# Patient Record
Sex: Female | Born: 1940
Health system: Southern US, Community
[De-identification: ages and names within clinical notes are randomized; demographics above are authoritative.]

## PROBLEM LIST (undated history)

## (undated) DIAGNOSIS — F32A Depression, unspecified: Secondary | ICD-10-CM

## (undated) DIAGNOSIS — G301 Alzheimer's disease with late onset: Secondary | ICD-10-CM

## (undated) DIAGNOSIS — F03A0 Unspecified dementia, mild, without behavioral disturbance, psychotic disturbance, mood disturbance, and anxiety: Secondary | ICD-10-CM

## (undated) DIAGNOSIS — D0360 Melanoma in situ of unspecified upper limb, including shoulder: Secondary | ICD-10-CM

## (undated) DIAGNOSIS — F329 Major depressive disorder, single episode, unspecified: Secondary | ICD-10-CM

## (undated) DIAGNOSIS — M8580 Other specified disorders of bone density and structure, unspecified site: Secondary | ICD-10-CM

## (undated) DIAGNOSIS — G4700 Insomnia, unspecified: Secondary | ICD-10-CM

## (undated) DIAGNOSIS — F0390 Unspecified dementia without behavioral disturbance: Secondary | ICD-10-CM

## (undated) DIAGNOSIS — C342 Malignant neoplasm of middle lobe, bronchus or lung: Secondary | ICD-10-CM

## (undated) DIAGNOSIS — J449 Chronic obstructive pulmonary disease, unspecified: Secondary | ICD-10-CM

## (undated) HISTORY — DX: Insomnia, unspecified: G47.00

## (undated) HISTORY — DX: Unspecified dementia without behavioral disturbance: F03.90

## (undated) HISTORY — DX: Chronic obstructive pulmonary disease, unspecified: J44.9

## (undated) HISTORY — PX: TUBAL LIGATION: SHX77

## (undated) HISTORY — DX: Unspecified dementia, mild, without behavioral disturbance, psychotic disturbance, mood disturbance, and anxiety: F03.A0

## (undated) HISTORY — DX: Other specified disorders of bone density and structure, unspecified site: M85.80

## (undated) HISTORY — DX: Melanoma in situ of unspecified upper limb, including shoulder: D03.60

## (undated) HISTORY — DX: Major depressive disorder, single episode, unspecified: F32.9

## (undated) HISTORY — DX: Malignant neoplasm of middle lobe, bronchus or lung: C34.2

## (undated) HISTORY — DX: Alzheimer's disease with late onset: G30.1

## (undated) HISTORY — DX: Depression, unspecified: F32.A

---

## 2005-01-04 ENCOUNTER — Ambulatory Visit: Payer: Self-pay | Admitting: Family Medicine

## 2005-01-21 ENCOUNTER — Ambulatory Visit: Payer: Self-pay | Admitting: Family Medicine

## 2005-01-21 ENCOUNTER — Other Ambulatory Visit: Admission: RE | Admit: 2005-01-21 | Discharge: 2005-01-21 | Payer: Self-pay | Admitting: Family Medicine

## 2005-06-16 ENCOUNTER — Ambulatory Visit: Payer: Self-pay | Admitting: Family Medicine

## 2005-07-25 ENCOUNTER — Ambulatory Visit: Payer: Self-pay | Admitting: Family Medicine

## 2005-12-04 ENCOUNTER — Ambulatory Visit: Payer: Self-pay | Admitting: Family Medicine

## 2010-05-09 ENCOUNTER — Ambulatory Visit: Payer: Self-pay | Admitting: Thoracic Surgery (Cardiothoracic Vascular Surgery)

## 2010-05-13 ENCOUNTER — Ambulatory Visit (HOSPITAL_COMMUNITY)
Admission: RE | Admit: 2010-05-13 | Discharge: 2010-05-13 | Payer: Self-pay | Admitting: Thoracic Surgery (Cardiothoracic Vascular Surgery)

## 2010-05-20 ENCOUNTER — Inpatient Hospital Stay (HOSPITAL_COMMUNITY)
Admission: RE | Admit: 2010-05-20 | Discharge: 2010-05-23 | Payer: Self-pay | Admitting: Thoracic Surgery (Cardiothoracic Vascular Surgery)

## 2010-05-20 ENCOUNTER — Encounter: Payer: Self-pay | Admitting: Thoracic Surgery (Cardiothoracic Vascular Surgery)

## 2010-05-20 ENCOUNTER — Ambulatory Visit: Payer: Self-pay | Admitting: Thoracic Surgery (Cardiothoracic Vascular Surgery)

## 2010-05-21 HISTORY — PX: OTHER SURGICAL HISTORY: SHX169

## 2010-05-30 ENCOUNTER — Ambulatory Visit: Payer: Self-pay | Admitting: Thoracic Surgery (Cardiothoracic Vascular Surgery)

## 2010-06-13 ENCOUNTER — Ambulatory Visit: Payer: Self-pay | Admitting: Thoracic Surgery (Cardiothoracic Vascular Surgery)

## 2010-06-13 ENCOUNTER — Encounter
Admission: RE | Admit: 2010-06-13 | Discharge: 2010-06-13 | Payer: Self-pay | Admitting: Thoracic Surgery (Cardiothoracic Vascular Surgery)

## 2010-08-12 ENCOUNTER — Encounter
Admission: RE | Admit: 2010-08-12 | Discharge: 2010-08-12 | Payer: Self-pay | Admitting: Thoracic Surgery (Cardiothoracic Vascular Surgery)

## 2010-08-12 ENCOUNTER — Ambulatory Visit: Payer: Self-pay | Admitting: Thoracic Surgery (Cardiothoracic Vascular Surgery)

## 2010-10-19 ENCOUNTER — Other Ambulatory Visit: Payer: Self-pay | Admitting: Thoracic Surgery (Cardiothoracic Vascular Surgery)

## 2010-10-19 DIAGNOSIS — Z85118 Personal history of other malignant neoplasm of bronchus and lung: Secondary | ICD-10-CM

## 2010-11-07 ENCOUNTER — Encounter (INDEPENDENT_AMBULATORY_CARE_PROVIDER_SITE_OTHER): Payer: Medicare PPO | Admitting: Thoracic Surgery (Cardiothoracic Vascular Surgery)

## 2010-11-07 ENCOUNTER — Ambulatory Visit
Admission: RE | Admit: 2010-11-07 | Discharge: 2010-11-07 | Disposition: A | Discharge status: Home / Self Care | Payer: Medicare PPO | Source: Ambulatory Visit | Attending: Thoracic Surgery (Cardiothoracic Vascular Surgery) | Admitting: Thoracic Surgery (Cardiothoracic Vascular Surgery)

## 2010-11-07 DIAGNOSIS — Z85118 Personal history of other malignant neoplasm of bronchus and lung: Secondary | ICD-10-CM

## 2010-11-07 DIAGNOSIS — C342 Malignant neoplasm of middle lobe, bronchus or lung: Secondary | ICD-10-CM

## 2010-11-08 NOTE — Assessment & Plan Note (Addendum)
OFFICE VISIT  ASCENCION, STEGNER DOB:  12/23/1940                                        November 07, 2010 CHART #:  [id number]  The patient is a 70 year old woman who had right VATS and middle lobectomy for a stage IA, well-differentiated adenocarcinoma.  This was 1.7 cm in diameter back in August.  I saw her back most recently in November, at which time she was doing well with no evidence of recurrent disease.  She was still taking the occasional Percocet at that time. She now returns because she still has some pain.  She describes the pain and tenderness at the anterior most chest tube site and some pain radiating from that area.  She also has similar pain on the other side. She is taking one Percocet every few days.  Her basic complaint is that she says she has a lack of energy, have a general malaise, and she does not feel very energetic or like herself and that dates back to around the time of her surgery.  Her current medications are Percocet p.r.n.  She has no known drug allergies.  PHYSICAL EXAMINATION:  On physical examination, the patient is a well- appearing 70 year old woman in no acute distress.  Her blood pressure is 137/89, pulse 88, respirations 14, oxygen saturation is 98% on room air. Lungs have equal breath sounds bilaterally.  They are clear.  There is no rales or wheezing.  There is no cervical or supraclavicular adenopathy.  Her incisions are well-healed.  No signs of infection or recurrence.  Chest CT is done, has not been officially read as of yet there was some postsurgical changes on the right side with some scarring.  There is no significant effusions or adenopathy.  There is no sign of recurrent disease to my exam or with the official report which hopefully will have later today.  IMPRESSION:  The patient is a 70 year old woman, she had stage IA non- small cell lung cancer.  She is now 6 months out from resection with  no evidence of recurrent disease.  I will plan to see her back in 3 months, checked a plain chest x-ray at that time, unless there is something on the chest x-ray today that the radiologist was worried about, in this case, we could follow up with a CT.  She is still having some pain.  She is taking a Percocet every few days. I gave her prescription for another 30 of those one or up to two daily as needed.  In terms of her general malaise and lack of energy, it sounds like she may be slightly depressed, certainly then having severe clinical depression, but she does have some signs of maybe a mild depression.  I recommend she talk to Dr. Sudie Bailey, have the medical workup, thyroid testing or whether it needs to be done to evaluate that and see if she needs antidepressants.  Again, I will plan to see her back in 3 months.  We will do a plain chest x-ray at that time.  Salvatore Decent Dorris Fetch, M.D. Electronically Signed  SCH/MEDQ  D:  11/07/2010  T:  11/08/2010  Job:  [id number]  cc:   Philemon Kingdom

## 2010-12-13 LAB — BLOOD GAS, ARTERIAL
Acid-Base Excess: 0.4 mmol/L (ref 0.0–2.0)
Acid-Base Excess: 0.5 mmol/L (ref 0.0–2.0)
Bicarbonate: 24.2 mEq/L — ABNORMAL HIGH (ref 20.0–24.0)
Drawn by: 242311
Drawn by: 31276
O2 Saturation: 97.4 %
O2 Saturation: 99 %
Patient temperature: 98.6
Patient temperature: 98.6
Patient temperature: 98.6
TCO2: 25.4 mmol/L (ref 0–100)
TCO2: 26 mmol/L (ref 0–100)
pCO2 arterial: 36.9 mmHg (ref 35.0–45.0)
pCO2 arterial: 41.3 mmHg (ref 35.0–45.0)
pH, Arterial: 7.395 (ref 7.350–7.400)
pH, Arterial: 7.448 — ABNORMAL HIGH (ref 7.350–7.400)
pO2, Arterial: 122 mmHg — ABNORMAL HIGH (ref 80.0–100.0)

## 2010-12-13 LAB — TYPE AND SCREEN
ABO/RH(D): A POS
Antibody Screen: NEGATIVE

## 2010-12-13 LAB — CBC
HCT: 38.8 % (ref 36.0–46.0)
Hemoglobin: 10.5 g/dL — ABNORMAL LOW (ref 12.0–15.0)
Hemoglobin: 13.6 g/dL (ref 12.0–15.0)
MCH: 31.6 pg (ref 26.0–34.0)
MCH: 32.7 pg (ref 26.0–34.0)
MCH: 32.9 pg (ref 26.0–34.0)
MCHC: 32.8 g/dL (ref 30.0–36.0)
MCV: 96.4 fL (ref 78.0–100.0)
Platelets: 131 10*3/uL — ABNORMAL LOW (ref 150–400)
Platelets: 133 10*3/uL — ABNORMAL LOW (ref 150–400)
RBC: 3.32 MIL/uL — ABNORMAL LOW (ref 3.87–5.11)
RBC: 4.13 MIL/uL (ref 3.87–5.11)
WBC: 11.8 10*3/uL — ABNORMAL HIGH (ref 4.0–10.5)
WBC: 7.2 10*3/uL (ref 4.0–10.5)

## 2010-12-13 LAB — SURGICAL PCR SCREEN
MRSA, PCR: NEGATIVE
Staphylococcus aureus: NEGATIVE

## 2010-12-13 LAB — BASIC METABOLIC PANEL
Creatinine, Ser: 0.61 mg/dL (ref 0.4–1.2)
Glucose, Bld: 160 mg/dL — ABNORMAL HIGH (ref 70–99)
Sodium: 133 mEq/L — ABNORMAL LOW (ref 135–145)

## 2010-12-13 LAB — URINALYSIS, ROUTINE W REFLEX MICROSCOPIC
Glucose, UA: NEGATIVE mg/dL
Ketones, ur: NEGATIVE mg/dL
Nitrite: NEGATIVE
Urobilinogen, UA: 1 mg/dL (ref 0.0–1.0)
pH: 6.5 (ref 5.0–8.0)

## 2010-12-13 LAB — COMPREHENSIVE METABOLIC PANEL
AST: 16 U/L (ref 0–37)
Albumin: 2.6 g/dL — ABNORMAL LOW (ref 3.5–5.2)
Albumin: 4.3 g/dL (ref 3.5–5.2)
BUN: 11 mg/dL (ref 6–23)
BUN: 3 mg/dL — ABNORMAL LOW (ref 6–23)
CO2: 22 mEq/L (ref 19–32)
Calcium: 8.4 mg/dL (ref 8.4–10.5)
Chloride: 108 mEq/L (ref 96–112)
GFR calc Af Amer: >60 mL/min (ref >60)
GFR calc non Af Amer: >60 mL/min (ref >60)
Sodium: 138 mEq/L (ref 135–145)
Total Bilirubin: 0.7 mg/dL (ref 0.3–1.2)
Total Protein: 5 g/dL — ABNORMAL LOW (ref 6.0–8.3)
Total Protein: 6.9 g/dL (ref 6.0–8.3)

## 2010-12-13 LAB — URINE MICROSCOPIC-ADD ON

## 2010-12-13 LAB — ABO/RH: ABO/RH(D): A POS

## 2010-12-13 LAB — PROTIME-INR: INR: 0.91 (ref 0.00–1.49)

## 2011-01-31 IMAGING — CR DG CHEST 2V
2 series · 2 of 2 positions shown · non-contrast
Comparison: 05/23/2010

CLINICAL DATA: Right VATS.

CHEST - 2 VIEW

[w chest pa]
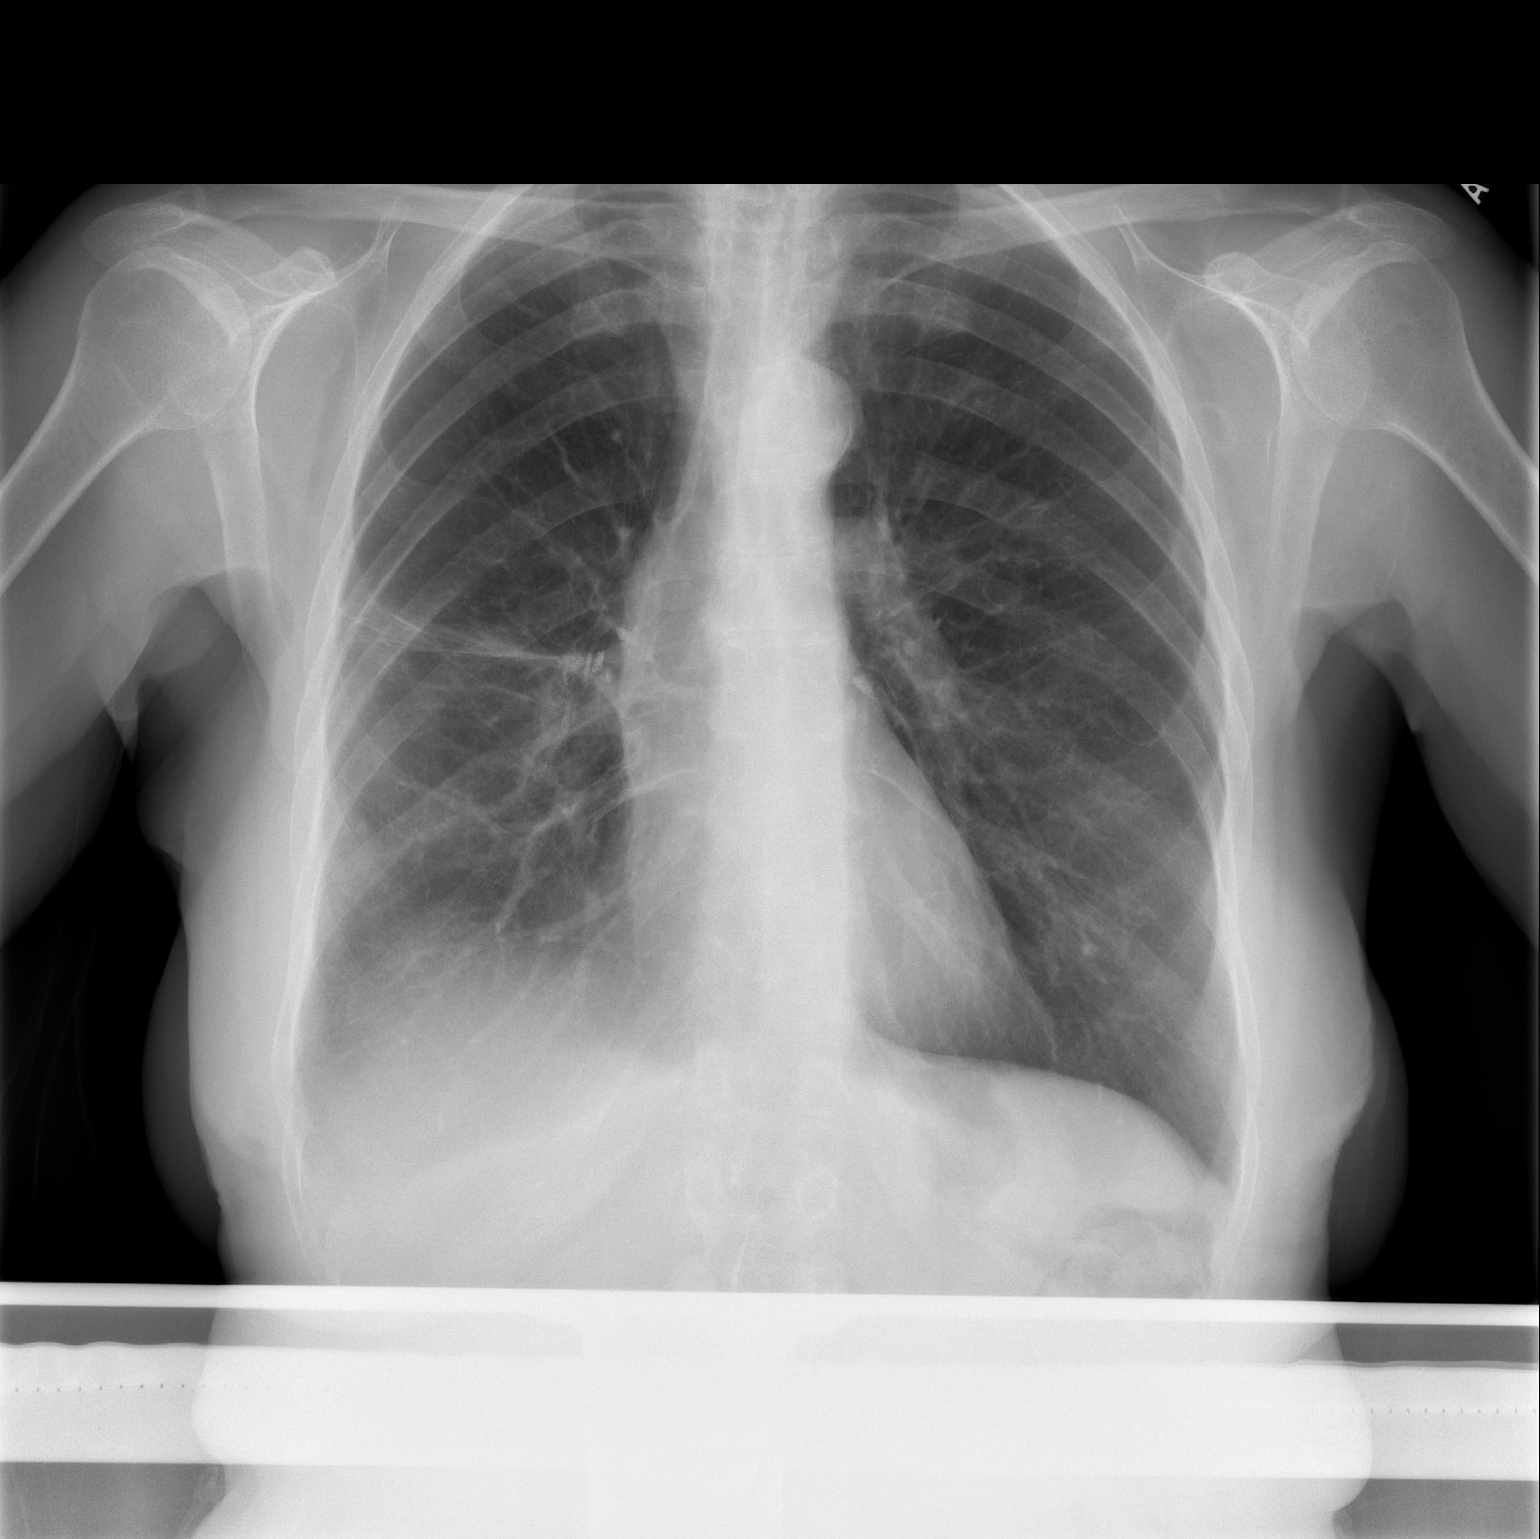

[w chest lat]
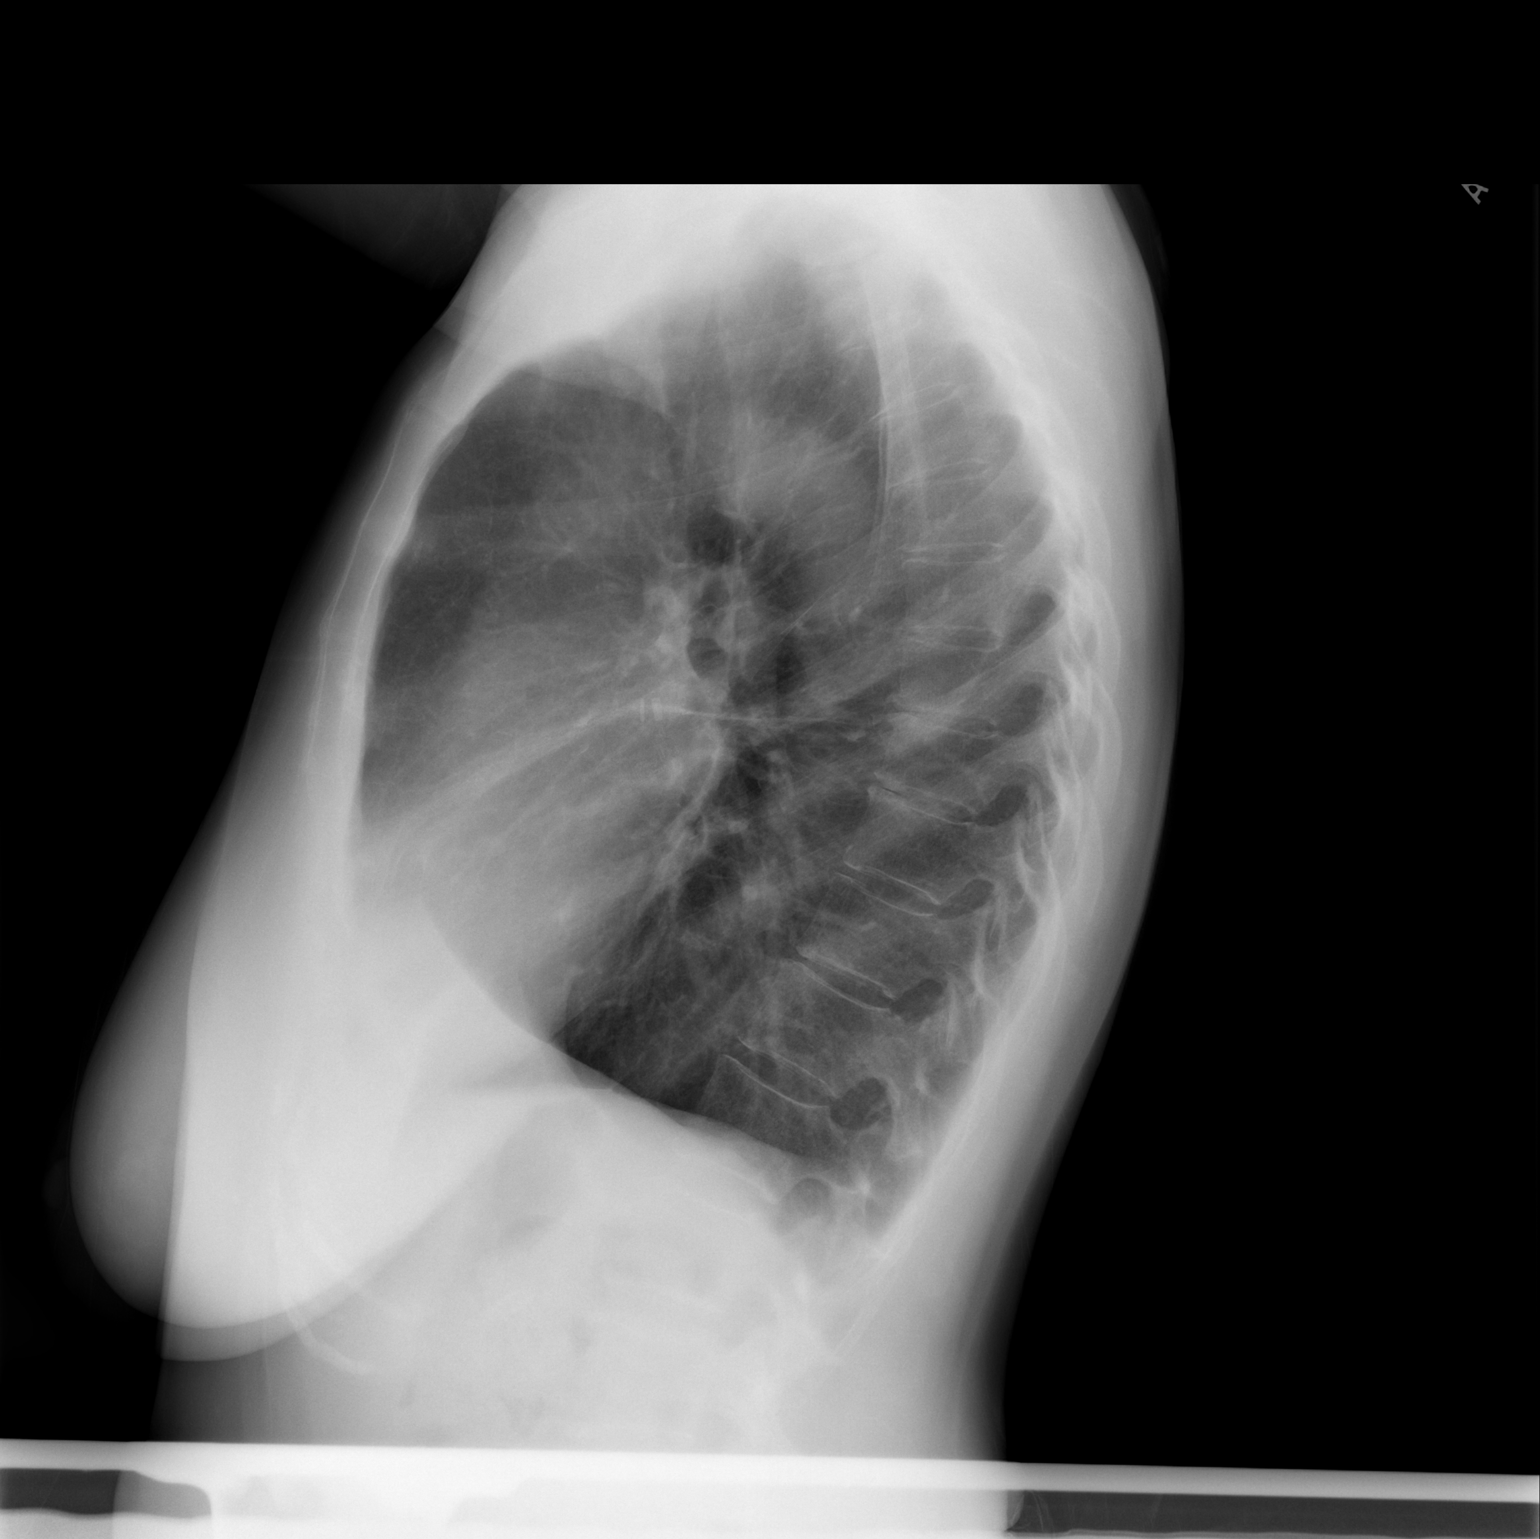

[2 of 2 positions shown; findings below may reference images not displayed]

FINDINGS: Postoperative changes on the right.  Small  right pleural
effusion noted with right lower lobe atelectasis.  Left lung is
clear.  Heart is normal size.  No acute bony abnormality.
IMPRESSION: Postoperative changes on the right with small right effusion and
right lower lobe atelectasis.

## 2011-02-10 ENCOUNTER — Other Ambulatory Visit: Payer: Self-pay | Admitting: Thoracic Surgery (Cardiothoracic Vascular Surgery)

## 2011-02-10 DIAGNOSIS — D381 Neoplasm of uncertain behavior of trachea, bronchus and lung: Secondary | ICD-10-CM

## 2011-02-11 ENCOUNTER — Ambulatory Visit
Admission: RE | Admit: 2011-02-11 | Discharge: 2011-02-11 | Disposition: A | Discharge status: Home / Self Care | Payer: Medicare PPO | Source: Ambulatory Visit | Attending: Thoracic Surgery (Cardiothoracic Vascular Surgery) | Admitting: Thoracic Surgery (Cardiothoracic Vascular Surgery)

## 2011-02-11 ENCOUNTER — Encounter (INDEPENDENT_AMBULATORY_CARE_PROVIDER_SITE_OTHER): Payer: Medicare PPO | Admitting: Thoracic Surgery (Cardiothoracic Vascular Surgery)

## 2011-02-11 DIAGNOSIS — D381 Neoplasm of uncertain behavior of trachea, bronchus and lung: Secondary | ICD-10-CM

## 2011-02-11 NOTE — Assessment & Plan Note (Signed)
OFFICE VISIT   Jessica [name], Jessica [name]  DOB:  Nov 15, 1940                                        June 13, 2010  CHART #:  [id number]   REASON FOR VISIT:  Follow up after right middle lobectomy.   HISTORY OF PRESENT ILLNESS:  The patient is a 70 year old woman with a  history of tobacco abuse who underwent a right video-assisted middle  lobectomy and node dissection in August 2011.  Postoperatively, she did  well.  She now returns a for follow up visit.  She says she is still  having some discomfort, occasionally sharp pains along the costal  margin, morbid, dull aching pain at the incision and chest tube sites.  She takes half an oxycodone tablet at nighttime before she goes to bed  and then primarily uses aspirin during the day.  She does say that about  once a week she will use half a tablet 3 or 4 times in a day.   PHYSICAL EXAMINATION:  The patient is a 70 year old woman in no acute  distress.  Her blood pressure is 112/75, pulse 66, respirations 16, her  oxygen saturation is 98% on room air.  Lungs are clear with equal breath  sounds bilaterally.  Her incisions are clean, dry, intact, and healing  well with no signs of infection.   LABORATORY DATA:  Chest x-ray shows postoperative changes on the right,  otherwise unremarkable.   IMPRESSION:  The patient is doing well.  Now, she is about a month out  from right middle lobectomy.  This was done minimally invasively.  She  still has some discomfort which is to be expected at this point in time  and actually may last for another month or two or in rare cases longer  than that; however, we were able to do this without any rib spreading,  so I think that the pain should resolve in a relatively short period of  time.  She is healing well.  She is not having any respiratory problems.  She is really taking very minimal amount of pain medication.  I did give  her a prescription for an additional 20 oxycodone  tablets up to 2  tablets three 3 times a day as needed for pain and she knows to call  back if she needs any additional pain medication.  I will plan to see  her back in 2 months.  We will do a plain chest x-ray at that time, then  we will plan to do a CT of the chest in 93-month and 1-year followup  visits.  She has no restrictions on her activities.  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   Salvatore Decent Dorris Fetch, M.D.  Electronically Signed   SCH/MEDQ  D:  06/13/2010  T:  06/14/2010  Job:  [id number]

## 2011-02-11 NOTE — H&P (Signed)
HISTORY AND PHYSICAL EXAMINATION   May 09, 2010   Re:  DE[place], [place]            DOB:  1940-12-08   CHIEF COMPLAINT:  Right lung mass.   HISTORY OF PRESENT ILLNESS:  The patient is a 70 year old woman with a  history of tobacco abuse who was found approximately 5 years ago to have  a small right middle lobe nodule.  She was lost to follow up and she  recently went to a new physician, Dr. Sudie Bailey.  Given her age and  smoking history, it was recommended that she have a chest x-ray done.  This showed an enlarged right middle lobe mass.  CT of the chest then  was done which again showed interval enlargement of a solitary  spiculated lung nodule on the lateral aspect of the right middle lobe,  which measured 1.6 x 2.0 cm.  There was a benign calcified granuloma in  the left lower lobe and some benign calcified left hilar nodes.  There  was no hilar or mediastinal adenopathy.  She then had a PET CT done,  which showed increased uptake of the right middle lobe nodule which had  an SUV max of 4.7.  There also was some uptake in the right hilum which  was not associated with any definite adenopathy and this represents  physiologic activity.  The patient now sent for consideration of  surgical treatment.   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   Her current medications are Klonopin 0.5 mg p.o. q.h.s. p.r.n.Marland Kitchen   She has no known drug allergies.   FAMILY HISTORY:  Negative for lung cancer.   SOCIAL HISTORY:  She lives in her own house.  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  Her grandson who is 6 years old currently  lives with her.  She is active and maintains her home.  She has a  history of tobacco abuse which was two packs a day until 1997.  Since  then she has been intermittently stopping and starting smoking for  several months up to a year at that time.  She  is not currently smoking.   REVIEW OF SYSTEMS:  She has lost a few pounds, less than 10 pounds over  the last 6 months.  She does have arthritis, occasional joint pain.  She  does have some depression.  All other systems are negative.   PHYSICAL EXAMINATION:  The patient is a well-appearing 70 year old woman  in no acute distress.  Blood pressure is 119/78, pulse 76, respirations  16.  Her oxygen saturation is 97% on room air.  General:  She is well  developed and well nourished.  Neurologically, she is alert and oriented  x3 with no focal deficits.  Her HEENT exam is unremarkable.  Her neck is  supple without thyromegaly, adenopathy, or bruits.  Her cardiac exam has  regular rate and rhythm.  Normal S1, S2.  No rubs, murmurs, or gallops.  There is no cervical or supraclavicular adenopathy.  Lungs are clear  with equal breath sounds bilaterally.  Abdomen soft, nontender.  Extremities without clubbing, cyanosis, or edema.  She has 2+ pulses  throughout.   LABORATORY DATA:  Radiologic studies were reviewed.  There is no other  laboratory data available at the present time.   IMPRESSION:  The patient is a 70 year old woman with a history  of  tobacco abuse who has a spiculated right middle lobe lesion that has  grown slowly after first being discovered 5 years ago and then  subsequently being lost to follow up.  This is highly suspicious for  primary bronchogenic cancer.  I discussed with the patient the approach  to workup.  This could be needled or bronchoscopically biopsied.  However, that would not change management as this would have  definitively be ruled out as a cancer and I would not be comfortable  given the clinical scenario of an enlarging hypermetabolic mass in a  smoker with anything other than a complete excisional biopsy to rule out  this is a carcinoma.  I recommended that we proceed with VATS and right  middle lobectomy.  I think even trying to wedge this lesion out is   probably going to leave very little usable right middle lobe tissue or  functional tissue.  Therefore, we will just proceed with lobectomy  assuming that a pulmonary function testing is adequate to tolerate that  which should be the case.   We do need to schedule her for pulmonary function testing with a room  air blood gas prior to surgery.   I discussed in detail with the patient and her daughter the general  nature of the operation.  We discussed indications, risks, benefits, and  alternatives including radiation or radiofrequency ablation.  They  understand the need for general anesthesia and the intention to treat  with minimally invasive technique, but they do understand that there is  always a possibility to need to extend the incision or possibly spread  the ribs that could arise during the operation even if the initial  intention is minimally invasive non-rib spreading approach.   They understand the risks include but not limited to death, MI, DVT, PE,  bleeding, possible need for transfusions, infections including wound  infections, IV catheters, pneumonia, bronchitis, or other unforeseeable  and unpredictable infections, a great deal of concern regarding  potential for infection and we discussed that at great length.   The patient understands these issues and wishes to proceed with right  VATS and middle lobectomy on Thursday, August 25.  I will be happy to do  this sooner but she has some family issues that need to be resolved  prior to having her surgery.   Salvatore Decent Dorris Fetch, M.D.  Electronically Signed   SCH/MEDQ  D:  05/09/2010  T:  05/10/2010  Job:  [id number]

## 2011-02-11 NOTE — Assessment & Plan Note (Signed)
OFFICE VISIT   Jessica, [name]  DOB:  1941/05/09                                        August 12, 2010  CHART #:  [id number]   HISTORY:  The patient returns for followup.  She had a right middle  lobectomy and mediastinal node dissection done in August of this year  for a non-small cell carcinoma.  This was a 1.7-cm T1 lesion with  negative nodes, and stage IA.  She was last seen in the office in  September, at which time she was doing well.  She was still having some  pain.  She was taking 2-3 Percocets a day at that time.  She says she is  continuing to have pain mostly referred pain to the right subcostal  region.  She says that she had gone over this most of the time, but  maybe every 5-7 days.  She will take 1-2 Percocets at night and in the  morning.  She tried Advil and Aleve and really did not get much relief.  She was taking aspirin but had to stop that because of a nosebleed.   CURRENT MEDICATIONS:  Percocet 5/325 one or two daily p.r.n.   REVIEW OF SYSTEMS:  Pain as noted.  She also had a recent colonoscopy  and also had a recent nose bleed.  No problems with her breathing  shortness of breath or wheezing.  All other systems are negative.   PHYSICAL EXAMINATION:  General:  The patient is a 70 year old woman in  no acute distress.  Vital Signs:  Her blood pressure is 104/72, pulse  84, respirations were 18, oxygen saturation is 97% on room air.  Lungs:  Clear with equal breath sounds bilaterally.  Cardiac:  Regular rate and  rhythm.  Normal S1 and S2.  No rubs or murmurs.  Her incisions are well  healed.   Chest x-ray shows good aeration.  There are some postsurgical changes on  the right.  No evidence of recurrent disease.   IMPRESSION:  The patient is a 70 year old woman status post a right  middle lobectomy via VATS approach.  She is still having some incisional  discomfort.  I gave her prescription for an additional 30 Percocets 1-2  twice daily as needed for pain, but I did encourage her to try and stop  using those if she can given they are narcotics.  She has tried Aleve  and Advil without any relief, but she may want to try those again and  she is a little further out from the surgery at 3 months now.  It is not  uncommon, she still has some discomfort but really should not be  requiring narcotics on any kind of regular basis.  I will plan to see  her back in 3 months.  We will do a CT of the chest at that time for  _________ 48-month followup postop.   Salvatore Decent Dorris Fetch, M.D.  Electronically Signed   SCH/MEDQ  D:  08/12/2010  T:  08/13/2010  Job:  [id number]   cc:   Philemon Kingdom

## 2011-02-13 ENCOUNTER — Other Ambulatory Visit: Payer: Self-pay | Admitting: Thoracic Surgery (Cardiothoracic Vascular Surgery)

## 2011-02-13 DIAGNOSIS — D381 Neoplasm of uncertain behavior of trachea, bronchus and lung: Secondary | ICD-10-CM

## 2011-02-14 ENCOUNTER — Encounter: Payer: Medicare PPO | Admitting: Thoracic Surgery (Cardiothoracic Vascular Surgery)

## 2011-02-17 ENCOUNTER — Other Ambulatory Visit: Payer: Self-pay | Admitting: Thoracic Surgery (Cardiothoracic Vascular Surgery)

## 2011-02-18 ENCOUNTER — Encounter: Payer: Medicare PPO | Admitting: Thoracic Surgery (Cardiothoracic Vascular Surgery)

## 2011-02-19 ENCOUNTER — Encounter (INDEPENDENT_AMBULATORY_CARE_PROVIDER_SITE_OTHER): Payer: Medicare PPO | Admitting: Thoracic Surgery (Cardiothoracic Vascular Surgery)

## 2011-02-19 DIAGNOSIS — C3490 Malignant neoplasm of unspecified part of unspecified bronchus or lung: Secondary | ICD-10-CM

## 2011-02-20 NOTE — Assessment & Plan Note (Signed)
OFFICE VISIT  Jessica [name], Jessica [name] DOB:  [birth year]                                        Feb 19, 2011 CHART #:  [id number]  REASON FOR VISIT:  Nine-month followup after lung cancer.  HISTORY OF PRESENT ILLNESS:  The patient is a 70 year old woman who had a right middle lobectomy for stage IA non-small cell carcinoma back in August 2011.  She was last seen in the office in February at which time she was doing well with no evidence of recurrent disease.  She was still having pain from her incisions for which she was taking Percocet.  She states that she has continued to have the pain.  She is concerned that there was some mistake made with chest tube removal and the way the suture was tied which left a scar with a different appearance in the other chest tube site.  She says that the pain is more constant now.  It used to be very severe and then let out.  Now it is more of a moderate pain more consistently.  She states that she has not had any difficulty with her breathing.  Her weight has been stable.  She has not had any unusual cough or hemoptysis.  No headaches or visual changes.  No new bone or joint pain.  She says she is currently taking one Percocet tablet about every fifth day for her pain.  CURRENT MEDICATIONS: 1. Klonopin 0.5 mg p.o. at bedtime. 2. Percocet 5/325 one tablet p.r.n. every few days for pain.  ALLERGIES:  She has no known drug allergies.  PAST MEDICAL HISTORY:  Significant for stage IA non-small cell lung cancer resected in August 2011, history of in situ melanoma on her arm, osteopenia, and osteoporosis.  SOCIAL HISTORY:  She is not smoking.  PHYSICAL EXAMINATION:  The patient is a well-appearing 70 year old woman in no acute distress.  Blood pressure is 121/83, pulse 68, respirations are 16, oxygen saturation is 99% on room air.  Lungs clear with equal breath sounds bilaterally.  Her incisions are all well healed.  There is no  cervical, supraclavicular, axillary, epitrochlear adenopathy.  IMAGING:  Chest x-ray is reviewed, shows surgical scarring in the right base, otherwise no evidence of recurrent disease.  IMPRESSION:  The patient is a 70 year old woman status post right middle lobectomy via video-assisted thoracoscopic surgery approach for stage IA lung cancer.  She is now 9 months out from surgery with no evidence of recurrent disease.  She does have continued pain from the surgery.  I reassured her that it was not due to the way the chest tube suture was tied as her pain was in the dermatomal distribution consistent with irritation of the nerve during surgery or from the chest tubes but not the way of the suture in the skin was tied.  She continues to take a Percocet about every fifth day for that.  I gave her a prescription for oxycodone 5 mg tablets, she can supplement that with Tylenol if she wishes.  I gave her a prescription for 60 tablets with no refills.  I will plan to see her back in August for a 1-year followup.  We will do a CT of the chest at that time.  Salvatore Decent Dorris Fetch, M.D. Electronically Signed  SCH/MEDQ  D:  02/19/2011  T:  02/20/2011  Job:  [id number] 

## 2011-04-10 ENCOUNTER — Other Ambulatory Visit: Payer: Self-pay | Admitting: Thoracic Surgery (Cardiothoracic Vascular Surgery)

## 2011-04-10 DIAGNOSIS — C3490 Malignant neoplasm of unspecified part of unspecified bronchus or lung: Secondary | ICD-10-CM

## 2011-05-26 ENCOUNTER — Ambulatory Visit: Payer: Medicare PPO | Admitting: Thoracic Surgery (Cardiothoracic Vascular Surgery)

## 2011-05-26 ENCOUNTER — Other Ambulatory Visit: Payer: Medicare PPO

## 2011-06-26 ENCOUNTER — Encounter: Payer: Self-pay | Admitting: Thoracic Surgery (Cardiothoracic Vascular Surgery)

## 2011-06-26 DIAGNOSIS — M8580 Other specified disorders of bone density and structure, unspecified site: Secondary | ICD-10-CM | POA: Insufficient documentation

## 2011-07-01 ENCOUNTER — Ambulatory Visit (INDEPENDENT_AMBULATORY_CARE_PROVIDER_SITE_OTHER): Payer: Medicare PPO | Admitting: Thoracic Surgery (Cardiothoracic Vascular Surgery)

## 2011-07-01 ENCOUNTER — Ambulatory Visit
Admission: RE | Admit: 2011-07-01 | Discharge: 2011-07-01 | Disposition: A | Discharge status: Home / Self Care | Payer: Medicare PPO | Source: Ambulatory Visit | Attending: Thoracic Surgery (Cardiothoracic Vascular Surgery) | Admitting: Thoracic Surgery (Cardiothoracic Vascular Surgery)

## 2011-07-01 ENCOUNTER — Encounter: Payer: Self-pay | Admitting: Thoracic Surgery (Cardiothoracic Vascular Surgery)

## 2011-07-01 VITALS — BP 133/86 | HR 74 | Resp 16 | Ht 63.0 in | Wt 136.0 lb

## 2011-07-01 DIAGNOSIS — C342 Malignant neoplasm of middle lobe, bronchus or lung: Secondary | ICD-10-CM

## 2011-07-01 DIAGNOSIS — C3490 Malignant neoplasm of unspecified part of unspecified bronchus or lung: Secondary | ICD-10-CM

## 2011-07-01 NOTE — Progress Notes (Signed)
PCP is PROCHNAU,CAROLINE, MD Referring Provider is Philemon Kingdom, MD  Chief Complaint  Patient presents with  . Lung Cancer    6 mo f/u with ct chest    HPI: Jessica [name] is a 70 year old woman who underwent right middle lobectomy on 05/20/2010 for 1.7 cm T1 N0 stage IA well-differentiated adenocarcinoma with bronchoalveolar features. 9 lymph nodes were negative for tumor. She was last seen in the office in May at which time she was doing well she still is taking one Percocet tablet about every fifth day for incisional complaints. She returns today for one year followup visit with a CT of the chest.  She states that since her last visit she still is using Percocet about every fifth day she denies any change in her breathing pattern shortness of breath or chest pain, outside of the occasional incisional pain. She's not had any significant weight loss. No new headaches or visual changes.  Past Medical History  Diagnosis Date  . Osteopenia   . Osteoporosis   . Melanoma in situ of upper arm   . Lung cancer, middle lobe     Past Surgical History  Procedure Date  . Right vats, right middle lobectomy, mediastinal lymph node 05/21/2010    No family history on file.  Social History History  Substance Use Topics  . Smoking status: Former Games developer  . Smokeless tobacco: Not on file  . Alcohol Use: No    Current Outpatient Prescriptions  Medication Sig Dispense Refill  . oxyCODONE-acetaminophen (PERCOCET) 5-325 MG per tablet Take 1 tablet by mouth every 4 (four) hours as needed.          No Known Allergies  Review of Systems: See history of present illness  BP 133/86  Pulse 74  Resp 16  Ht 5\' 3"  (1.6 m)  Wt 136 lb (61.689 kg)  BMI 24.09 kg/m2  SpO2 96% Physical Exam: 70 year old woman in no acute distress well-developed and well-nourished.  Neurologic alert and oriented x3 no focal deficits.  No supraclavicular cervical or axillary adenopathy  Lungs clear with equal  breath sounds bilaterally.  Chest incision is well healed.  Cardiac exam is regular rate and rhythm normal S1 and S2  Diagnostic Tests: CT of the chest shows scarring from her previous right middle lobectomy. No evidence recurrent disease. No hilar or mediastinal adenopathy.  Impression: 70 year old woman a little over one year out from resection of the stage IA 1.7 cm adenocarcinoma with bronchoalveolar features with no evidence of recurrent disease.  Jessica [name] expressed significant concern about repeated radiologic evaluations. She is referred from her daughter the she could develop cancer related to radiation exposure. I have tried to reassure her that with the followup x-rays that we'll be doing that that is extremely unlikely in her case and the her risk is much greater of developing a recurrence of her lung cancer. And that would be more life threatening than developing a radiation-induced tumor.  Jessica [name] also expressed a desire to discuss end-of-life issues. She says that she has a sensation that something that could have her in the not too distant future and that she would not necessarily want aggressive medical care if that were the case. I strongly recommend that she discuss these issues with her primary physician Dr. Sudie Bailey.  Plan: I will plan to see Jessica [name] back in 4 months with a plain chest x-ray at that time.  She will call if she has any issues that need to be addressed the  interim.  I did give her a prescription for oxycodone 5 mg tablets one by mouth daily when necessary, dispense 40, no refills

## 2011-11-12 ENCOUNTER — Other Ambulatory Visit: Payer: Self-pay | Admitting: Thoracic Surgery (Cardiothoracic Vascular Surgery)

## 2011-11-12 DIAGNOSIS — C3490 Malignant neoplasm of unspecified part of unspecified bronchus or lung: Secondary | ICD-10-CM

## 2011-11-18 ENCOUNTER — Encounter: Payer: Medicare PPO | Admitting: Thoracic Surgery (Cardiothoracic Vascular Surgery)

## 2011-11-20 ENCOUNTER — Other Ambulatory Visit: Payer: Self-pay | Admitting: Thoracic Surgery (Cardiothoracic Vascular Surgery)

## 2011-11-20 DIAGNOSIS — C342 Malignant neoplasm of middle lobe, bronchus or lung: Secondary | ICD-10-CM

## 2011-11-20 DIAGNOSIS — C3490 Malignant neoplasm of unspecified part of unspecified bronchus or lung: Secondary | ICD-10-CM

## 2011-11-25 ENCOUNTER — Ambulatory Visit
Admission: RE | Admit: 2011-11-25 | Discharge: 2011-11-25 | Disposition: A | Discharge status: Home / Self Care | Payer: Medicare PPO | Source: Ambulatory Visit | Attending: Thoracic Surgery (Cardiothoracic Vascular Surgery) | Admitting: Thoracic Surgery (Cardiothoracic Vascular Surgery)

## 2011-11-25 ENCOUNTER — Encounter: Payer: Self-pay | Admitting: Thoracic Surgery (Cardiothoracic Vascular Surgery)

## 2011-11-25 ENCOUNTER — Ambulatory Visit (INDEPENDENT_AMBULATORY_CARE_PROVIDER_SITE_OTHER): Payer: Medicare PPO | Admitting: Thoracic Surgery (Cardiothoracic Vascular Surgery)

## 2011-11-25 VITALS — BP 123/73 | HR 72 | Resp 18 | Ht 63.0 in | Wt 134.0 lb

## 2011-11-25 DIAGNOSIS — Z9889 Other specified postprocedural states: Secondary | ICD-10-CM

## 2011-11-25 DIAGNOSIS — Z902 Acquired absence of lung [part of]: Secondary | ICD-10-CM

## 2011-11-25 DIAGNOSIS — Z85118 Personal history of other malignant neoplasm of bronchus and lung: Secondary | ICD-10-CM

## 2011-11-25 DIAGNOSIS — C342 Malignant neoplasm of middle lobe, bronchus or lung: Secondary | ICD-10-CM

## 2011-11-25 NOTE — Progress Notes (Signed)
HPI: Jessica [name] is a 71 year old woman who had a right middle lobectomy for a stage IA non-small cell carcinoma in August of 2011. She now returns for a scheduled 18 month followup visit. She states that in the interim since her last visit she's been doing well. She has not had any difficulty with her breathing. She found a new type of broad doesn't irritate her incisions and has not been having to take Percocet. She does still have some discomfort that typically is not severe.  Current Outpatient Prescriptions  Medication Sig Dispense Refill  . aspirin 325 MG tablet Take 325 mg by mouth as needed.        Past Medical History  Diagnosis Date  . Osteopenia   . Osteoporosis   . Melanoma in situ of upper arm   . Lung cancer, middle lobe     Physical Exam: BP 123/73  Pulse 72  Resp 18  Ht 5\' 3"  (1.6 m)  Wt 134 lb (60.782 kg)  BMI 23.74 kg/m2  SpO2 99% Gen. well-developed and well-nourished, in no acute distress Lungs clear with equal breath sounds bilaterally Cardiac regular rate and rhythm no murmur No cervical, supraclavicular, axillary or epitrochlear adenopathy  Diagnostic Tests: Chest x-ray shows scarring in the right chest, no evidence recurrent disease  Impression: Jessica [name] is a 72 year old woman now 18 months out from a right middle lobectomy for stage I a non-small cell carcinoma. She has no evidence recurrent disease  Plan: I will see her back in 3 months with a plain chest x-ray, we will repeat her chest CT in August at her 2 year followup visit

## 2012-02-12 ENCOUNTER — Other Ambulatory Visit: Payer: Self-pay | Admitting: Thoracic Surgery (Cardiothoracic Vascular Surgery)

## 2012-02-12 DIAGNOSIS — C3490 Malignant neoplasm of unspecified part of unspecified bronchus or lung: Secondary | ICD-10-CM

## 2012-02-12 DIAGNOSIS — D381 Neoplasm of uncertain behavior of trachea, bronchus and lung: Secondary | ICD-10-CM

## 2012-02-17 ENCOUNTER — Ambulatory Visit: Payer: Medicare PPO | Admitting: Thoracic Surgery (Cardiothoracic Vascular Surgery)

## 2012-02-18 ENCOUNTER — Other Ambulatory Visit: Payer: Self-pay | Admitting: Thoracic Surgery (Cardiothoracic Vascular Surgery)

## 2012-02-18 DIAGNOSIS — C3490 Malignant neoplasm of unspecified part of unspecified bronchus or lung: Secondary | ICD-10-CM

## 2012-02-24 ENCOUNTER — Ambulatory Visit: Payer: Medicare PPO | Admitting: Thoracic Surgery (Cardiothoracic Vascular Surgery)

## 2012-03-01 ENCOUNTER — Other Ambulatory Visit: Payer: Self-pay | Admitting: Thoracic Surgery (Cardiothoracic Vascular Surgery)

## 2012-03-01 DIAGNOSIS — C342 Malignant neoplasm of middle lobe, bronchus or lung: Secondary | ICD-10-CM

## 2012-03-01 DIAGNOSIS — C3490 Malignant neoplasm of unspecified part of unspecified bronchus or lung: Secondary | ICD-10-CM

## 2012-03-02 ENCOUNTER — Ambulatory Visit
Admission: RE | Admit: 2012-03-02 | Discharge: 2012-03-02 | Disposition: A | Discharge status: Home / Self Care | Payer: Medicare PPO | Source: Ambulatory Visit | Attending: Thoracic Surgery (Cardiothoracic Vascular Surgery) | Admitting: Thoracic Surgery (Cardiothoracic Vascular Surgery)

## 2012-03-02 ENCOUNTER — Ambulatory Visit (INDEPENDENT_AMBULATORY_CARE_PROVIDER_SITE_OTHER): Payer: Medicare PPO | Admitting: Thoracic Surgery (Cardiothoracic Vascular Surgery)

## 2012-03-02 ENCOUNTER — Encounter: Payer: Self-pay | Admitting: Thoracic Surgery (Cardiothoracic Vascular Surgery)

## 2012-03-02 VITALS — BP 125/86 | HR 76 | Resp 18 | Ht 63.0 in | Wt 134.0 lb

## 2012-03-02 DIAGNOSIS — C342 Malignant neoplasm of middle lobe, bronchus or lung: Secondary | ICD-10-CM

## 2012-03-02 DIAGNOSIS — Z9889 Other specified postprocedural states: Secondary | ICD-10-CM

## 2012-03-02 DIAGNOSIS — Z85118 Personal history of other malignant neoplasm of bronchus and lung: Secondary | ICD-10-CM

## 2012-03-02 DIAGNOSIS — Z902 Acquired absence of lung [part of]: Secondary | ICD-10-CM

## 2012-03-02 NOTE — Progress Notes (Signed)
  HPI:  Jessica [name] returns for a scheduled 3 month followup appointment. She was last in the office in February at which time she was doing well. Today she states that she continued to do well. She does have some vague crampy right upper quadrant pain at times. For a while this was causing a lot of discomfort, but recently has been much less of a problem. Her weight has been stable. She denies cough, wheezing, shortness of breath, hemoptysis, new bone or joint pain, headaches or visual changes.  Past Medical History  Diagnosis Date  . Osteopenia   . Osteoporosis   . Melanoma in situ of upper arm   . Lung cancer, middle lobe      Current Outpatient Prescriptions  Medication Sig Dispense Refill  . aspirin 325 MG tablet Take 325 mg by mouth as needed.        Physical Exam BP 125/86  Pulse 76  Resp 18  Ht 5\' 3"  (1.6 m)  Wt 134 lb (60.782 kg)  BMI 23.74 kg/m2  SpO2 98% General well-developed well-nourished no acute distress No cervical or supraclavicular adenopathy Lungs clear equal bilaterally  Diagnostic Tests: Chest x-ray shows no active disease  Impression: 71 year old woman now 21 months out from right middle lobectomy for stage IA adenocarcinoma. She is no evidence recurrent disease.   Plan: Return in 3 months with a CT of the chest and upper abdomen for her 2 year followup appointment.

## 2012-05-10 ENCOUNTER — Other Ambulatory Visit: Payer: Self-pay | Admitting: Thoracic Surgery (Cardiothoracic Vascular Surgery)

## 2012-05-10 DIAGNOSIS — R1011 Right upper quadrant pain: Secondary | ICD-10-CM

## 2012-05-10 DIAGNOSIS — C342 Malignant neoplasm of middle lobe, bronchus or lung: Secondary | ICD-10-CM

## 2012-06-15 ENCOUNTER — Ambulatory Visit (INDEPENDENT_AMBULATORY_CARE_PROVIDER_SITE_OTHER): Payer: Medicare PPO | Admitting: Thoracic Surgery (Cardiothoracic Vascular Surgery)

## 2012-06-15 ENCOUNTER — Ambulatory Visit
Admission: RE | Admit: 2012-06-15 | Discharge: 2012-06-15 | Disposition: A | Discharge status: Home / Self Care | Payer: Medicare PPO | Source: Ambulatory Visit | Attending: Thoracic Surgery (Cardiothoracic Vascular Surgery) | Admitting: Thoracic Surgery (Cardiothoracic Vascular Surgery)

## 2012-06-15 VITALS — BP 143/84 | HR 75 | Resp 14 | Ht 64.0 in | Wt 138.0 lb

## 2012-06-15 DIAGNOSIS — C342 Malignant neoplasm of middle lobe, bronchus or lung: Secondary | ICD-10-CM

## 2012-06-15 DIAGNOSIS — R1011 Right upper quadrant pain: Secondary | ICD-10-CM

## 2012-06-15 DIAGNOSIS — Z9889 Other specified postprocedural states: Secondary | ICD-10-CM

## 2012-06-15 DIAGNOSIS — Z902 Acquired absence of lung [part of]: Secondary | ICD-10-CM

## 2012-06-15 MED ORDER — IOHEXOL 300 MG/ML  SOLN
100.0000 mL | Freq: Once | INTRAMUSCULAR | Status: AC | PRN
  Administered 2012-06-15: 100 mL via INTRAVENOUS

## 2012-06-15 NOTE — Progress Notes (Signed)
HPI:  Jessica [name] returns today for a scheduled 2 year followup visit. She had a right middle lobectomy for stage IA non-small cell carcinoma in August 2011. We've been following her every 3 months. She was most recently seen in June which time she had no evidence of recurrent disease. She had an ultrasound of her gallbladder and liver to evaluate right upper quadrant pain. This showed no evidence of gallstones, there was mild fatty infiltration of the liver. She says it is always she avoids fatty foods her pain is much better, although she still does have some discomfort there.  Her breathing has been stable. She denies shortness of breath, cough, hemoptysis, wheezing. She's not had any unusual headaches or visual changes. She's not had any new bone or joint pains. She denies chest pain, pressure or tightness.  Past Medical History  Diagnosis Date  . Osteopenia   . Osteoporosis   . Melanoma in situ of upper arm   . Lung cancer, middle lobe     Past Surgical History  Procedure Date  . Right vats, right middle lobectomy, mediastinal lymph node 05/21/2010      Current Outpatient Prescriptions  Medication Sig Dispense Refill  . traZODone (DESYREL) 50 MG tablet Take 50 mg by mouth at bedtime.      Marland Kitchen aspirin 325 MG tablet Take 325 mg by mouth as needed.       No current facility-administered medications for this visit.   Facility-Administered Medications Ordered in Other Visits  Medication Dose Route Frequency Provider Last Rate Last Dose  . iohexol (OMNIPAQUE) 300 MG/ML solution 100 mL  100 mL Intravenous Once PRN Medication Radiologist, MD   100 mL at 06/15/12 1154    Physical Exam BP 143/84  Pulse 75  Resp 14  Ht 5\' 4"  (1.626 m)  Wt 138 lb (62.596 kg)  BMI 23.69 kg/m2  SpO2 99% Gen. 71 year old woman in no acute distress, well-developed and well-nourished No cervical, supraclavicular, or epitrochlear adenopathy Lungs clear with equal breath sounds bilaterally Cardiac  regular rate and rhythm normal S1 and S2; no rubs, murmurs or gallops Right chest incisions well-healed  Diagnostic Tests: CT chest and abdomen 06/15/2012 *RADIOLOGY REPORT*  Clinical Data: Lung cancer history, upper abdominal pain.  CT CHEST WITH CONTRAST,CT ABDOMEN WITH CONTRAST  Technique: Multidetector CT imaging of the chest was performed  following the standard protocol during bolus administration of  intravenous contrast.,Technique: Multidetector CT imaging of the  abdomen was performed following the standard protocol during b  Contrast: OMNIPAQUE IOHEXOL 300 MG/ML SOLN  Comparison: 04/30/2010 PET-CT, 11/07/2010 chest CT  Findings:  Chest: Main pulmonary arterial branches are patent. There is  ectasia of the ascending aorta, measuring up to 3.7 cm. Scattered  atherosclerosis. No dissection. Normal heart size. No pleural or  pericardial effusion.  Postsurgical change along the periphery of the right lung and right  hilum in keeping with prior right middle lobectomy. Centrolobular  emphysematous changes are present 4 mm nodule within the left lower  lobe is unchanged and may contain calcification. No new or  suspicious areas of nodularity.  Small hiatal hernia. No intrathoracic lymphadenopathy.  IMPRESSION:  Status post right middle lobectomy.  Emphysematous changes.  No definite evidence for recurrent or metastatic disease within the  chest.  Abdomen: Multiple hypodensities scattered throughout the liver are  nonspecific however similar to minimally larger from the 2008  prior. This is superimposed on a background low attenuation of the  liver. Unremarkable biliary system, spleen,  pancreas, adrenal  glands.  Symmetric renal enhancement. No hydronephrosis or proximal  hydroureter. Nonobstructing left renal stone. Parapelvic cysts on  the left.  Visualized bowel loops are normal course and caliber. No free  intra-abdominal air or fluid. No intra-abdominal  lymphadenopathy.  Scattered atherosclerosis of the abdominal aorta and branch  vessels. No aneurysmal dilatation.  Mild multilevel degenerative change.  Impression: Low attenuation of the liver suggests fatty  infiltration. Focal hypodensities scattered throughout the liver  are similar in distribution to prior. Some measure minimally  larger; favored to be a non aggressive process such as biliary  hamartomas.  Impression: 71 year old woman now 2 years out from a right middle lobectomy for stage IA non-small cell carcinoma. She has no evidence recurrent disease. Did review with her that most recurrences do occur within the first 2 years. However, she has to be followed for 5 years before we can consider her cured.   Plan: I will see her back in 6 months with a CT of the chest.  She knows to call if she has any questions or concerns in the interim

## 2012-11-03 ENCOUNTER — Other Ambulatory Visit: Payer: Self-pay | Admitting: *Deleted

## 2012-11-03 DIAGNOSIS — C342 Malignant neoplasm of middle lobe, bronchus or lung: Secondary | ICD-10-CM

## 2012-11-13 ENCOUNTER — Other Ambulatory Visit: Payer: Self-pay

## 2012-12-14 ENCOUNTER — Inpatient Hospital Stay: Admission: RE | Admit: 2012-12-14 | Payer: Medicare PPO | Source: Ambulatory Visit

## 2012-12-14 ENCOUNTER — Ambulatory Visit: Payer: Medicare PPO | Admitting: Thoracic Surgery (Cardiothoracic Vascular Surgery)

## 2012-12-24 LAB — CREATININE, SERUM: Creat: 0.7 mg/dL (ref 0.50–1.10)

## 2012-12-24 LAB — BUN: BUN: 7 mg/dL (ref 6–23)

## 2012-12-28 ENCOUNTER — Encounter: Payer: Self-pay | Admitting: Thoracic Surgery (Cardiothoracic Vascular Surgery)

## 2012-12-28 ENCOUNTER — Ambulatory Visit (INDEPENDENT_AMBULATORY_CARE_PROVIDER_SITE_OTHER): Payer: Medicare PPO | Admitting: Thoracic Surgery (Cardiothoracic Vascular Surgery)

## 2012-12-28 ENCOUNTER — Ambulatory Visit: Payer: Medicare PPO | Admitting: Thoracic Surgery (Cardiothoracic Vascular Surgery)

## 2012-12-28 ENCOUNTER — Ambulatory Visit
Admission: RE | Admit: 2012-12-28 | Discharge: 2012-12-28 | Disposition: A | Discharge status: Home / Self Care | Payer: Medicare PPO | Source: Ambulatory Visit | Attending: Thoracic Surgery (Cardiothoracic Vascular Surgery) | Admitting: Thoracic Surgery (Cardiothoracic Vascular Surgery)

## 2012-12-28 VITALS — BP 139/87 | HR 74 | Resp 16 | Ht 65.0 in | Wt 135.0 lb

## 2012-12-28 DIAGNOSIS — Z09 Encounter for follow-up examination after completed treatment for conditions other than malignant neoplasm: Secondary | ICD-10-CM

## 2012-12-28 DIAGNOSIS — E041 Nontoxic single thyroid nodule: Secondary | ICD-10-CM

## 2012-12-28 DIAGNOSIS — C342 Malignant neoplasm of middle lobe, bronchus or lung: Secondary | ICD-10-CM

## 2012-12-28 MED ORDER — IOHEXOL 300 MG/ML  SOLN
75.0000 mL | Freq: Once | INTRAMUSCULAR | Status: AC | PRN
  Administered 2012-12-28: 75 mL via INTRAVENOUS

## 2012-12-28 NOTE — Progress Notes (Signed)
  HPI:  Jessica [name] returns today for a 6 month followup visit. She was last seen in September which was her 2 year followup visit. She had no evidence of current disease at that time. Since her last visit she says that she's been doing well. She's had any problems with chest pain, shortness of breath, cough, hemoptysis, weight loss, headaches or visual changes, or new bone or joint pain.  Past Medical History  Diagnosis Date  . Osteopenia   . Osteoporosis   . Melanoma in situ of upper arm   . Lung cancer, middle lobe       Current Outpatient Prescriptions  Medication Sig Dispense Refill  . aspirin 325 MG tablet Take 325 mg by mouth as needed.       No current facility-administered medications for this visit.    Physical Exam BP 139/87  Pulse 74  Resp 16  Ht 5\' 5"  (1.651 m)  Wt 135 lb (61.236 kg)  BMI 22.47 kg/m2  SpO89 94%  72 year old woman in no acute distress Neurologic alert and oriented x3 no focal deficits No palpable thyromegaly, cervical or supraclavicular adenopathy Lungs clear with equal breath sounds bilaterally Cardiac regular rate and rhythm normal S1 and S2  Diagnostic Tests: CT of chest 12/28/12 CT CHEST WITH CONTRAST  Technique: Multidetector CT imaging of the chest was performed  following the standard protocol during bolus administration of  intravenous contrast.  Contrast: 75mL OMNIPAQUE IOHEXOL 300 MG/ML SOLN  Comparison: CT chest of 06/15/2012  Findings: A small calcified granuloma again is noted within the  lingula. Changes of centrilobular emphysema are present. However,  there is no evidence of recurrence of lung carcinoma. No pleural  effusion is seen. There is mild scarring at the right lung base  after right middle lobectomy previously.  A small left thyroid nodule is noted of approximately 8 mm in  diameter. If necessary clinically, ultrasound of the thyroid could  be performed. This nodule is not definitely seen on the prior CT.  The  thoracic aorta opacifies and the origins of the great vessels  are patent. The pulmonary arteries are unremarkable. Multiple  calcified left hilar nodes are present from prior granulomatous  disease. However, no mediastinal or hilar adenopathy is seen. A  superior mediastinal lymph node to the right of midline is stable  in size. Scattered sub centimeter low attenuation hepatic lesions  are most consistent with cysts which appear stable. Only mild  degenerative change is present in the mid to lower thoracic spine.  IMPRESSION:  1. No evidence of recurrence of lung carcinoma.  2. Calcified left hilar nodes and calcified granuloma in the  lingula consistent with prior granulomatous disease.  3. 8 mm low attenuation nodule in the left lobe of thyroid.  Consider ultrasound of the thyroid if warranted clinically.  Original Report Authenticated By: Dwyane Dee, M.D.  Impression: 72 year old woman now 2-1/2 years out from a right middle lobectomy for stage IA non-small cell carcinoma. She has no evidence recurrent disease.  There was a finding of an 8 mm low attenuation nodule the left lobe of the thyroid. The radiologist recommended an ultrasound, so we will go ahead and schedule that. We will call her with results.  Plan: Ultrasound of thyroid  Return in 6 months with CT of chest for 3 year followup visit

## 2012-12-29 ENCOUNTER — Other Ambulatory Visit: Payer: Self-pay

## 2012-12-29 DIAGNOSIS — E041 Nontoxic single thyroid nodule: Secondary | ICD-10-CM

## 2013-01-04 ENCOUNTER — Ambulatory Visit (HOSPITAL_COMMUNITY)
Admission: RE | Admit: 2013-01-04 | Discharge: 2013-01-04 | Disposition: A | Discharge status: Home / Self Care | Payer: Medicare PPO | Source: Ambulatory Visit | Attending: Thoracic Surgery (Cardiothoracic Vascular Surgery) | Admitting: Thoracic Surgery (Cardiothoracic Vascular Surgery)

## 2013-01-04 DIAGNOSIS — E042 Nontoxic multinodular goiter: Secondary | ICD-10-CM | POA: Insufficient documentation

## 2013-01-04 DIAGNOSIS — E041 Nontoxic single thyroid nodule: Secondary | ICD-10-CM

## 2013-01-11 ENCOUNTER — Telehealth: Payer: Self-pay | Admitting: Thoracic Surgery (Cardiothoracic Vascular Surgery)

## 2013-01-11 NOTE — Telephone Encounter (Signed)
Called to discuss results of thyroid ultrasound  It showed multiple small nodules none of which met criteria for biopsy  Recommended possible repeat of sound in 12 months.   Patient will discuss with Dr. Sudie Bailey her primary care physician at her next visit.

## 2013-05-04 ENCOUNTER — Other Ambulatory Visit: Payer: Self-pay

## 2013-06-24 ENCOUNTER — Other Ambulatory Visit: Payer: Self-pay | Admitting: *Deleted

## 2013-07-19 ENCOUNTER — Encounter: Payer: Self-pay | Admitting: Thoracic Surgery (Cardiothoracic Vascular Surgery)

## 2013-07-19 ENCOUNTER — Ambulatory Visit
Admission: RE | Admit: 2013-07-19 | Discharge: 2013-07-19 | Disposition: A | Discharge status: Home / Self Care | Payer: Medicare PPO | Source: Ambulatory Visit | Attending: Thoracic Surgery (Cardiothoracic Vascular Surgery) | Admitting: Thoracic Surgery (Cardiothoracic Vascular Surgery)

## 2013-07-19 ENCOUNTER — Ambulatory Visit (INDEPENDENT_AMBULATORY_CARE_PROVIDER_SITE_OTHER): Payer: Medicare PPO | Admitting: Thoracic Surgery (Cardiothoracic Vascular Surgery)

## 2013-07-19 ENCOUNTER — Ambulatory Visit: Payer: Medicare PPO | Admitting: Thoracic Surgery (Cardiothoracic Vascular Surgery)

## 2013-07-19 VITALS — BP 121/81 | HR 82 | Resp 20 | Ht 65.0 in | Wt 135.0 lb

## 2013-07-19 DIAGNOSIS — Z85118 Personal history of other malignant neoplasm of bronchus and lung: Secondary | ICD-10-CM

## 2013-07-19 DIAGNOSIS — Z9889 Other specified postprocedural states: Secondary | ICD-10-CM

## 2013-07-19 DIAGNOSIS — Z902 Acquired absence of lung [part of]: Secondary | ICD-10-CM

## 2013-07-19 MED ORDER — IOHEXOL 300 MG/ML  SOLN
75.0000 mL | Freq: Once | INTRAMUSCULAR | Status: AC | PRN
  Administered 2013-07-19: 75 mL via INTRAVENOUS

## 2013-07-19 NOTE — Progress Notes (Signed)
HPI:  Jessica [name] is a 72 year old woman who underwent right middle lobectomy on 05/20/2010 for 1.7 cm T1 N0 stage IA well-differentiated adenocarcinoma with bronchoalveolar features. 9 lymph nodes were negative for tumor.  She returns today for a scheduled 3 year followup visit.  She was last in the office in April at which time she was doing well. There was an abnormality noted on her thyroid gland on the CT at that time. We followed that up with an ultrasound which showed multiple small cysts but nothing that was suspicious or warranted biopsy.  She had an MRI back in May during evaluation of some memory loss. It was normal for age.  ROS  she has been having memory problems. No unusual headaches or visual changes No chest pain, shortness of breath, cough, hemoptysis or wheezing. She lost about 5 pounds between doctors visits last Spring, but has been stable since then. Her appetite remains good. She's not had any new or unusual bone or joint complaints. She did have a squamous cell carcinoma removed from her left arm a few days ago.  Past Medical History  Diagnosis Date  . Osteopenia   . Osteoporosis   . Melanoma in situ of upper arm   . Lung cancer, middle lobe        Current Outpatient Prescriptions  Medication Sig Dispense Refill  . aspirin 325 MG tablet Take 325 mg by mouth as needed.      . cephALEXin (KEFLEX) 500 MG capsule Take 500 mg by mouth 3 (three) times daily.      Marland Kitchen HYDROcodone-acetaminophen (NORCO/VICODIN) 5-325 MG per tablet Take 1 tablet by mouth every 6 (six) hours as needed for pain.       No current facility-administered medications for this visit.    Physical Exam BP 121/81  Pulse 82  Resp 20  Ht 5\' 5"  (1.651 m)  Wt 135 lb (61.236 kg)  BMI 22.47 kg/m2  SpO67 27% 72 year old woman in no acute distress Alert and oriented x3 with no focal motor deficits No cervical or suprapubic or adenopathy Cardiac regular rate and rhythm normal S1 and S2 no rubs  or gallops Lungs clear with equal breath sounds bilaterally Incisions well healed  Diagnostic Tests: CT of chest in 2114 CLINICAL DATA: Followup lung carcinoma. Personal history of  melanoma.  EXAM:  CT CHEST WITH CONTRAST  TECHNIQUE:  Multidetector CT imaging of the chest was performed during  intravenous contrast administration.  CONTRAST: 75mL OMNIPAQUE IOHEXOL 300 MG/ML SOLN  COMPARISON: 12/28/2012  FINDINGS:  Mild scarring in the anterior right lung is stable in appearance.  Mild emphysema again noted. No suspicious pulmonary nodules or  masses are identified. No evidence of pulmonary infiltrate or  central endobronchial lesion.  No evidence of pleural or pericardial effusion. No hilar or  mediastinal masses are identified. No adenopathy seen elsewhere  within the thorax. Tiny sub cm low-attenuation lesion in the left  thyroid lobe is stable.  Both adrenal glands are normal in appearance. No suspicious bone  lesions identified. Tiny sub cm low-attenuation lesions in the liver  remains stable, presumably tiny cysts.  IMPRESSION:  Stable exam. No evidence of recurrent or metastatic carcinoma within  the thorax.  Electronically Signed  By: Myles Rosenthal M.D.  On: 07/19/2013 13:09   Impression: 73 year old woman who is now 3 years out from a right middle lobectomy for stage IA non-small cell carcinoma. She has no evidence recurrent disease.  Her recommended that she return in 6 months  with a CT scan. She strongly wishes not to have a CT due to co-pay expense. Given that she is now 3 years out without any suspicious findings on her CT today, I do think it would be reasonable to just do a chest x-ray at the 6 month visit. She will need a CT in one year.   Plan: Return in 6 months with PA and lateral chest x-ray.

## 2013-08-04 ENCOUNTER — Other Ambulatory Visit: Payer: Self-pay

## 2013-11-03 ENCOUNTER — Telehealth: Payer: Self-pay | Admitting: *Deleted

## 2013-11-03 NOTE — Telephone Encounter (Signed)
Jessica [name] called to inform Dr. Roxan Hockey that she has decided to not follow up in our office anymore.  She said that she would not have anymore surgery if anything was found, so she so no need in any more xrays or visits.  She has talked with her primary physician about this also.  I told her that I would relay this to Dr. Roxan Hockey.

## 2014-01-17 ENCOUNTER — Ambulatory Visit: Payer: Medicare PPO | Admitting: Thoracic Surgery (Cardiothoracic Vascular Surgery)

## 2014-01-24 ENCOUNTER — Ambulatory Visit: Payer: Medicare PPO | Admitting: Thoracic Surgery (Cardiothoracic Vascular Surgery)

## 2016-10-02 DIAGNOSIS — M545 Low back pain: Secondary | ICD-10-CM | POA: Diagnosis not present

## 2016-10-02 DIAGNOSIS — M9902 Segmental and somatic dysfunction of thoracic region: Secondary | ICD-10-CM | POA: Diagnosis not present

## 2016-10-02 DIAGNOSIS — S336XXA Sprain of sacroiliac joint, initial encounter: Secondary | ICD-10-CM | POA: Diagnosis not present

## 2016-10-02 DIAGNOSIS — M5384 Other specified dorsopathies, thoracic region: Secondary | ICD-10-CM | POA: Diagnosis not present

## 2016-10-02 DIAGNOSIS — M5136 Other intervertebral disc degeneration, lumbar region: Secondary | ICD-10-CM | POA: Diagnosis not present

## 2016-10-02 DIAGNOSIS — M50322 Other cervical disc degeneration at C5-C6 level: Secondary | ICD-10-CM | POA: Diagnosis not present

## 2016-10-02 DIAGNOSIS — M9905 Segmental and somatic dysfunction of pelvic region: Secondary | ICD-10-CM | POA: Diagnosis not present

## 2016-10-02 DIAGNOSIS — M50323 Other cervical disc degeneration at C6-C7 level: Secondary | ICD-10-CM | POA: Diagnosis not present

## 2016-10-02 DIAGNOSIS — M9901 Segmental and somatic dysfunction of cervical region: Secondary | ICD-10-CM | POA: Diagnosis not present

## 2016-10-02 DIAGNOSIS — M542 Cervicalgia: Secondary | ICD-10-CM | POA: Diagnosis not present

## 2016-10-02 DIAGNOSIS — M9903 Segmental and somatic dysfunction of lumbar region: Secondary | ICD-10-CM | POA: Diagnosis not present

## 2017-02-11 DIAGNOSIS — F0390 Unspecified dementia without behavioral disturbance: Secondary | ICD-10-CM | POA: Diagnosis not present

## 2017-03-02 DIAGNOSIS — L820 Inflamed seborrheic keratosis: Secondary | ICD-10-CM | POA: Diagnosis not present

## 2017-03-27 DIAGNOSIS — F5101 Primary insomnia: Secondary | ICD-10-CM | POA: Diagnosis not present

## 2017-03-27 DIAGNOSIS — F0390 Unspecified dementia without behavioral disturbance: Secondary | ICD-10-CM | POA: Diagnosis not present

## 2017-04-30 DIAGNOSIS — F0390 Unspecified dementia without behavioral disturbance: Secondary | ICD-10-CM | POA: Diagnosis not present

## 2017-04-30 DIAGNOSIS — F5101 Primary insomnia: Secondary | ICD-10-CM | POA: Diagnosis not present

## 2017-05-27 DIAGNOSIS — F0390 Unspecified dementia without behavioral disturbance: Secondary | ICD-10-CM | POA: Diagnosis not present

## 2017-05-27 DIAGNOSIS — F5101 Primary insomnia: Secondary | ICD-10-CM | POA: Diagnosis not present

## 2017-08-10 DIAGNOSIS — L03011 Cellulitis of right finger: Secondary | ICD-10-CM | POA: Diagnosis not present

## 2017-08-10 DIAGNOSIS — M79642 Pain in left hand: Secondary | ICD-10-CM | POA: Diagnosis not present

## 2017-10-21 DIAGNOSIS — Z1389 Encounter for screening for other disorder: Secondary | ICD-10-CM | POA: Diagnosis not present

## 2017-10-21 DIAGNOSIS — Z1331 Encounter for screening for depression: Secondary | ICD-10-CM | POA: Diagnosis not present

## 2017-10-21 DIAGNOSIS — R5383 Other fatigue: Secondary | ICD-10-CM | POA: Diagnosis not present

## 2017-10-21 DIAGNOSIS — Z23 Encounter for immunization: Secondary | ICD-10-CM | POA: Diagnosis not present

## 2017-10-21 DIAGNOSIS — Z0001 Encounter for general adult medical examination with abnormal findings: Secondary | ICD-10-CM | POA: Diagnosis not present

## 2017-10-28 DIAGNOSIS — M810 Age-related osteoporosis without current pathological fracture: Secondary | ICD-10-CM | POA: Diagnosis not present

## 2017-10-28 DIAGNOSIS — R5383 Other fatigue: Secondary | ICD-10-CM | POA: Diagnosis not present

## 2017-10-28 DIAGNOSIS — E785 Hyperlipidemia, unspecified: Secondary | ICD-10-CM | POA: Diagnosis not present

## 2017-11-26 DIAGNOSIS — F5101 Primary insomnia: Secondary | ICD-10-CM | POA: Diagnosis not present

## 2017-11-26 DIAGNOSIS — Z85820 Personal history of malignant melanoma of skin: Secondary | ICD-10-CM | POA: Diagnosis not present

## 2018-01-27 ENCOUNTER — Ambulatory Visit (INDEPENDENT_AMBULATORY_CARE_PROVIDER_SITE_OTHER): Payer: PPO | Admitting: Allergy and Immunology

## 2018-01-27 ENCOUNTER — Encounter: Payer: Self-pay | Admitting: Allergy and Immunology

## 2018-01-27 VITALS — BP 120/78 | HR 72 | Temp 98.2°F | Resp 16

## 2018-01-27 DIAGNOSIS — G4709 Other insomnia: Secondary | ICD-10-CM

## 2018-01-27 DIAGNOSIS — J3089 Other allergic rhinitis: Secondary | ICD-10-CM

## 2018-01-27 DIAGNOSIS — Z85118 Personal history of other malignant neoplasm of bronchus and lung: Secondary | ICD-10-CM | POA: Diagnosis not present

## 2018-01-27 DIAGNOSIS — R1013 Epigastric pain: Secondary | ICD-10-CM

## 2018-01-27 MED ORDER — RANITIDINE HCL 300 MG PO TABS: ORAL_TABLET | ORAL | 5 refills | Status: DC

## 2018-01-27 MED ORDER — CYPROHEPTADINE HCL 4 MG PO TABS: ORAL_TABLET | ORAL | 5 refills | Status: DC

## 2018-01-27 NOTE — Patient Instructions (Addendum)
  1.  Allergen avoidance measures  2.  Eliminate mold and dehumidify house  3.  Treat insomnia with Periactin 4 mg tablet at bedtime  4.  Treat dyspepsia with ranitidine 300 mg at bedtime  5.  Treat inflammation with OTC Nasacort 1 spray each nostril once a day  6.  Further evaluation?  GI studies?  7.  Return to clinic in 3 weeks or earlier if problem

## 2018-01-27 NOTE — Progress Notes (Signed)
Dear Dr. Laqueta Due,  Thank you for referring Jessica [name] to the [facility] on 01/27/2018.   Below is a summation of this patient's evaluation and recommendations.  Thank you for your referral. I will keep you informed about this patient's response to treatment.   If you have any questions please do not hesitate to contact me.   Sincerely,  Jiles Prows, MD Allergy / Immunology [facility]   ______________________________________________________________________    NEW PATIENT NOTE  Referring Provider: Ernestene Kiel, MD Primary Provider: Ernestene Kiel, MD Date of office visit: 01/27/2018    Subjective:   Chief Complaint:  Jessica [name] (DOB: 03/01/1941) is a 77 y.o. female who presents to the clinic on 01/27/2018 with a chief complaint of Insomnia; Nausea; and Dizziness .     HPI: Jessica [name] presents to this clinic in evaluation of possible mold allergy.  Jessica [name] has several issues that have been active over the course of the past year.  She has rather significant insomnia that has developed during this timeframe.  Most of her insomnia appears to be maintenance insomnia.  She has been treated with 3 different medications for insomnia and none of these medications have helped this issue.  In addition, she has a decreased appetite and has early satiety over the course of the past 4 months.  Her stomach just feels queasy all the time.  She does not have any classic reflux symptoms and she does not have any diarrhea or cramping.  As well, she has been having this "lightheadedness" without any obvious vertigo or tinnitus or problems with hearing loss.  She does occasionally have a frontal headache about twice a week that last 30 minutes but she does not think that this lightheadedness is associated with those headache.  She has also noticed a decrease in memory and more specifically in  word finding.  She does have some occasional runny nose and some occasional sneezing.  She does not have any associated anosmia or ugly nasal discharge or decreased ability to taste.  She has noticed that the vision on her right eye has been diminished over the course of the past year and her last eye exam was 1 year ago.  There is no obvious provoking factor giving rise to her nasal symptoms.  She has a history of right middle lobectomy on 05/20/2010 for 1.7 cm T1 N0 stage IA well-differentiated adenocarcinoma.  She believes that her last chest x-ray was performed in 2018 and was okay.  There has been a mold issue inside her house that was recently discovered and is being repaired.  Apparently there was a leak in a addition to the house with the Skylight and this resulted in mold growing in 1 of the walls.  Past Medical History:  Diagnosis Date  . Lung cancer, middle lobe ([place])   . Melanoma in situ of upper arm ([place])   . Osteopenia   . Osteoporosis     Past Surgical History:  Procedure Laterality Date  . Right VATS, right middle lobectomy, mediastinal lymph node  05/21/2010  . TUBAL LIGATION      Allergies as of 01/27/2018   No Known Allergies     Medication List      donepezil 10 MG tablet Commonly known as:  ARICEPT TK 1 T PO QD HS   MAGNESIUM PO Take by mouth.   VITAMIN B-12 PO Take by mouth.  VITAMIN D3 PO Take by mouth.       Review of systems negative except as noted in HPI / PMHx or noted below:  Review of Systems  Constitutional: Negative.   HENT: Negative.   Eyes: Negative.   Respiratory: Negative.   Cardiovascular: Negative.   Gastrointestinal: Negative.   Genitourinary: Negative.   Musculoskeletal: Negative.   Skin: Negative.   Neurological: Negative.   Endo/Heme/Allergies: Negative.   Psychiatric/Behavioral: Negative.     Family History  Problem Relation Age of Onset  . Alzheimer's disease Mother   . Cancer Mother   . Dementia Father   .  Cancer Father   . Alzheimer's disease Maternal Grandfather     Social History   Socioeconomic History  . Marital status: Single    Spouse name: Not on file  . Number of children: Not on file  . Years of education: Not on file  . Highest education level: Not on file  Occupational History  . Not on file  Social Needs  . Financial resource strain: Not on file  . Food insecurity:    Worry: Not on file    Inability: Not on file  . Transportation needs:    Medical: Not on file    Non-medical: Not on file  Tobacco Use  . Smoking status: Former Smoker    Types: Cigarettes    Last attempt to quit: 09/30/1995    Years since quitting: 22.3  . Smokeless tobacco: Never Used  Substance and Sexual Activity  . Alcohol use: No  . Drug use: No  . Sexual activity: Not on file  Lifestyle  . Physical activity:    Days per week: Not on file    Minutes per session: Not on file  . Stress: Not on file  Relationships  . Social connections:    Talks on phone: Not on file    Gets together: Not on file    Attends religious service: Not on file    Active member of club or organization: Not on file    Attends meetings of clubs or organizations: Not on file    Relationship status: Not on file  . Intimate partner violence:    Fear of current or ex partner: Not on file    Emotionally abused: Not on file    Physically abused: Not on file    Forced sexual activity: Not on file  Other Topics Concern  . Not on file  Social History Narrative  . Not on file    Environmental and Social history  Lives in a house with a mold problem, dogs and cats located inside the household, no carpet in the bedroom, plastic on the bed, no plastic on the pillow, and no smokers located inside the household.  She did smoke for approximately 40 years which she discontinued in 1998.  Objective:   Vitals:   01/27/18 1412  BP: 120/78  Pulse: 72  Resp: 16  Temp: 98.2 F (36.8 C)        Physical Exam  HENT:    Head: Normocephalic. Head is without right periorbital erythema and without left periorbital erythema.  Right Ear: Tympanic membrane, external ear and ear canal normal.  Left Ear: Tympanic membrane, external ear and ear canal normal.  Nose: Nose normal. No mucosal edema or rhinorrhea.  Mouth/Throat: Oropharynx is clear and moist and mucous membranes are normal. No oropharyngeal exudate.  Eyes: Pupils are equal, round, and reactive to light. Conjunctivae and lids are normal.  Neck: Trachea normal. No tracheal deviation present. No thyromegaly present.  Cardiovascular: Normal rate, regular rhythm, S1 normal, S2 normal and normal heart sounds.  No murmur heard. Pulmonary/Chest: Effort normal. No stridor. No respiratory distress. She has no wheezes. She has no rales. She exhibits no tenderness.  Abdominal: Soft. She exhibits no distension and no mass. There is no hepatosplenomegaly. There is no tenderness. There is no rebound and no guarding.  Musculoskeletal: She exhibits no edema or tenderness.  Lymphadenopathy:       Head (right side): No tonsillar adenopathy present.       Head (left side): No tonsillar adenopathy present.    She has no cervical adenopathy.    She has no axillary adenopathy.  Neurological: She is alert.  Skin: No rash noted. She is not diaphoretic. No erythema. No pallor. Nails show no clubbing.    Diagnostics: Allergy skin tests were performed.  She demonstrated hypersensitivity to dog.  Assessment and Plan:    1. Other allergic rhinitis   2. Other insomnia   3. Dyspepsia   4. History of lung cancer     1.  Allergen avoidance measures  2.  Eliminate mold and dehumidify house  3.  Treat insomnia with Periactin 4 mg tablet at bedtime  4.  Treat dyspepsia with ranitidine 300 mg at bedtime  5.  Treat inflammation with OTC Nasacort 1 spray each nostril once a day  6.  Further evaluation?  GI studies?  7.  Return to clinic in 3 weeks or earlier if  problem  Jessica [name] appears to have atopic disease directed against her dog and we will get her to perform allergen avoidance measures as best as possible.  As well, if there is mold in house she certainly needs to eliminate any mold exposure and we had a talk today about how to get that accomplished.  I will treat her with a low-dose nasal steroid to see if we can decrease any upper airway swelling that may exist which may be interfering with her ability to sleep.  As well, I will treat her insomnia with Periactin and her dyspepsia with ranitidine and will see what type of response we get over the course of the next 3 weeks.  She has dyspepsia and early satiety and decreased appetite and if this does not resolve with ranitidine then I think she probably needs to see a gastroenterologist in further investigation of this issue.  Although uncommon, though symptoms can certainly suggest a visceral abnormality with significant long-term health implications.  I will regroup with her in 3 weeks.  Jiles Prows, MD Allergy / Immunology [facility]

## 2018-01-28 ENCOUNTER — Encounter: Payer: Self-pay | Admitting: Allergy and Immunology

## 2018-02-01 DIAGNOSIS — M26602 Left temporomandibular joint disorder, unspecified: Secondary | ICD-10-CM | POA: Diagnosis not present

## 2018-02-01 DIAGNOSIS — R1011 Right upper quadrant pain: Secondary | ICD-10-CM | POA: Diagnosis not present

## 2018-02-01 DIAGNOSIS — F5101 Primary insomnia: Secondary | ICD-10-CM | POA: Diagnosis not present

## 2018-02-01 DIAGNOSIS — M542 Cervicalgia: Secondary | ICD-10-CM | POA: Diagnosis not present

## 2018-02-01 DIAGNOSIS — M25512 Pain in left shoulder: Secondary | ICD-10-CM | POA: Diagnosis not present

## 2018-02-03 DIAGNOSIS — M542 Cervicalgia: Secondary | ICD-10-CM | POA: Diagnosis not present

## 2018-02-18 ENCOUNTER — Ambulatory Visit (INDEPENDENT_AMBULATORY_CARE_PROVIDER_SITE_OTHER): Payer: PPO | Admitting: Allergy and Immunology

## 2018-02-18 ENCOUNTER — Encounter: Payer: Self-pay | Admitting: Allergy and Immunology

## 2018-02-18 VITALS — BP 102/62 | HR 68 | Resp 16

## 2018-02-18 DIAGNOSIS — G4700 Insomnia, unspecified: Secondary | ICD-10-CM | POA: Diagnosis not present

## 2018-02-18 DIAGNOSIS — J3089 Other allergic rhinitis: Secondary | ICD-10-CM

## 2018-02-18 DIAGNOSIS — R6881 Early satiety: Secondary | ICD-10-CM

## 2018-02-18 DIAGNOSIS — G4733 Obstructive sleep apnea (adult) (pediatric): Secondary | ICD-10-CM | POA: Diagnosis not present

## 2018-02-18 DIAGNOSIS — G4709 Other insomnia: Secondary | ICD-10-CM | POA: Diagnosis not present

## 2018-02-18 DIAGNOSIS — R1013 Epigastric pain: Secondary | ICD-10-CM

## 2018-02-18 DIAGNOSIS — R5383 Other fatigue: Secondary | ICD-10-CM | POA: Diagnosis not present

## 2018-02-18 MED ORDER — CYPROHEPTADINE HCL 4 MG PO TABS: ORAL_TABLET | ORAL | 5 refills | Status: DC

## 2018-02-18 NOTE — Progress Notes (Signed)
Follow-up Note  Referring Provider: Ernestene Kiel, MD Primary Provider: Ernestene Kiel, MD Date of Office Visit: 02/18/2018  Subjective:   Jessica [name] (DOB: [birth year]) is a 77 y.o. female who returns to the [place] on 02/18/2018 in re-evaluation of the following:  HPI: Karizma returns to this clinic in evaluation of allergic rhinitis, insomnia, and early satiety and dyspepsia.  I last saw her in this clinic during her initial evaluation of 27 Jan 2018.  Her nose is doing very well on her nasal steroid.  She feels as though she has much less nasal congestion.  Her insomnia is still very bad and she basically does not sleep.  She never started her Periactin.  She is scheduled for a sleep study.  She still remains with this queasy feeling in her stomach and she still has early satiety.  She has been using ranitidine.  Allergies as of 02/18/2018   No Known Allergies     Medication List      donepezil 10 MG tablet Commonly known as:  ARICEPT TK 1 T PO QD HS   MAGNESIUM PO Take by mouth.   NASACORT ALLERGY 24HR 55 MCG/ACT Aero nasal inhaler Generic drug:  triamcinolone Place 1 spray into the nose daily.   ranitidine 300 MG tablet Commonly known as:  ZANTAC Take one tablet once daily at bedtime   VITAMIN B-12 PO Take by mouth.   VITAMIN D3 PO Take by mouth.       Past Medical History:  Diagnosis Date  . Lung cancer, middle lobe ([place])   . Melanoma in situ of upper arm ([place])   . Osteopenia   . Osteoporosis     Past Surgical History:  Procedure Laterality Date  . Right VATS, right middle lobectomy, mediastinal lymph node  05/21/2010  . TUBAL LIGATION      Review of systems negative except as noted in HPI / PMHx or noted below:  Review of Systems  Constitutional: Negative.   HENT: Negative.   Eyes: Negative.   Respiratory: Negative.   Cardiovascular: Negative.   Gastrointestinal: Negative.   Genitourinary: Negative.     Musculoskeletal: Negative.   Skin: Negative.   Neurological: Negative.   Endo/Heme/Allergies: Negative.   Psychiatric/Behavioral: Negative.      Objective:   Vitals:   02/18/18 0839  BP: 102/62  Pulse: 68  Resp: 16          Physical Exam  HENT:  Head: Normocephalic. Head is without right periorbital erythema and without left periorbital erythema.  Right Ear: External ear normal.  Left Ear: External ear normal.  Nose: Nose normal. No mucosal edema or rhinorrhea.  Mouth/Throat: Oropharynx is clear and moist and mucous membranes are normal. No oropharyngeal exudate.  Wax both ears  Eyes: Pupils are equal, round, and reactive to light. Conjunctivae and lids are normal.  Neck: Trachea normal. No tracheal deviation present. No thyromegaly present.  Cardiovascular: Normal rate, regular rhythm, S1 normal, S2 normal and normal heart sounds.  No murmur heard. Pulmonary/Chest: Effort normal. No stridor. No respiratory distress. She has no wheezes. She has no rales. She exhibits no tenderness.  Abdominal: Soft. She exhibits no distension and no mass. There is no hepatosplenomegaly. There is no tenderness. There is no rebound and no guarding.  Musculoskeletal: She exhibits no edema or tenderness.  Lymphadenopathy:       Head (right side): No tonsillar adenopathy present.       Head (left side): No  tonsillar adenopathy present.    She has no cervical adenopathy.    She has no axillary adenopathy.  Neurological: She is alert.  Skin: No rash noted. She is not diaphoretic. No erythema. No pallor. Nails show no clubbing.    Diagnostics:   Review of a limited abdominal ultrasound performed 06 December 2013 identified normal gallbladder and common bile duct with a liver that had some slight inhomogeneous pattern and mild echogenic pattern suggesting mild fatty infiltration.  Review of a gallbladder emptying study performed 20 December 2013 identified normal hepatobiliary imaging with normal  gallbladder function.  Assessment and Plan:   1. Other allergic rhinitis   2. Other insomnia   3. Dyspepsia   4. Early satiety     1.  Treat insomnia with Periactin 4 mg one tablet at bedtime and have sleep study completed  2.  Treat dyspepsia and early satiety with ranitidine 300 mg at bedtime, obtain complete abdominal ultrasound, and have Dr. Laqueta Due refer you to a GI doctor  3.  Treat inflammation with OTC Nasacort 1 spray each nostril once a day  4.  Return to clinic in 8 weeks or earlier if problem    I have encouraged Marleta to start her Periactin to help with her insomnia and she can have her sleep study completed in investigation of possible sleep apnea contributing to this issue.  She still has dyspepsia and early satiety that did not respond to the administration of ranitidine.  Although she has had a limited abdominal ultrasound in the past that has been normal I think she requires a full abdominal ultrasound looking at her pancreas and if this study is normal then I think she needs to see a GI doctor regarding this issue.  Her GI complaints may all be secondary to the use of Aricept but there may be other issues ongoing as well.  I will see her back in this clinic in 8 weeks or earlier if there is a problem.  Allena Katz, MD Allergy / Immunology [place]

## 2018-02-18 NOTE — Patient Instructions (Addendum)
  1.  Treat insomnia with Periactin 4 mg one tablet at bedtime and have sleep study completed  2.  Treat dyspepsia and early satiety with ranitidine 300 mg at bedtime, obtain complete abdominal ultrasound, and have Dr. Laqueta Due refer you to a GI doctor  3.  Treat inflammation with OTC Nasacort 1 spray each nostril once a day  4.  Return to clinic in 8 weeks or earlier if problem

## 2018-02-19 ENCOUNTER — Telehealth: Payer: Self-pay | Admitting: *Deleted

## 2018-02-19 NOTE — Telephone Encounter (Signed)
Patient informed of Ultrasound appt date. At [facility] on May 31st at 8:30 arriving at 8:00. NPO 6 hours prior.

## 2018-02-23 ENCOUNTER — Encounter: Payer: Self-pay | Admitting: Allergy and Immunology

## 2018-02-26 DIAGNOSIS — R1013 Epigastric pain: Secondary | ICD-10-CM | POA: Diagnosis not present

## 2018-02-26 DIAGNOSIS — I700 Atherosclerosis of aorta: Secondary | ICD-10-CM | POA: Diagnosis not present

## 2018-02-26 DIAGNOSIS — N1330 Unspecified hydronephrosis: Secondary | ICD-10-CM | POA: Diagnosis not present

## 2018-02-26 DIAGNOSIS — N1339 Other hydronephrosis: Secondary | ICD-10-CM | POA: Diagnosis not present

## 2018-02-26 DIAGNOSIS — R6881 Early satiety: Secondary | ICD-10-CM | POA: Diagnosis not present

## 2018-03-01 ENCOUNTER — Encounter: Payer: Self-pay | Admitting: *Deleted

## 2018-03-18 DIAGNOSIS — H25813 Combined forms of age-related cataract, bilateral: Secondary | ICD-10-CM | POA: Diagnosis not present

## 2018-03-18 DIAGNOSIS — H40013 Open angle with borderline findings, low risk, bilateral: Secondary | ICD-10-CM | POA: Diagnosis not present

## 2018-03-23 DIAGNOSIS — F0390 Unspecified dementia without behavioral disturbance: Secondary | ICD-10-CM | POA: Diagnosis not present

## 2018-03-31 DIAGNOSIS — N1330 Unspecified hydronephrosis: Secondary | ICD-10-CM | POA: Diagnosis not present

## 2018-03-31 DIAGNOSIS — F0390 Unspecified dementia without behavioral disturbance: Secondary | ICD-10-CM | POA: Diagnosis not present

## 2018-04-02 DIAGNOSIS — H6121 Impacted cerumen, right ear: Secondary | ICD-10-CM | POA: Diagnosis not present

## 2018-04-08 DIAGNOSIS — H60501 Unspecified acute noninfective otitis externa, right ear: Secondary | ICD-10-CM | POA: Diagnosis not present

## 2018-04-08 DIAGNOSIS — H6121 Impacted cerumen, right ear: Secondary | ICD-10-CM | POA: Diagnosis not present

## 2018-04-12 ENCOUNTER — Ambulatory Visit (INDEPENDENT_AMBULATORY_CARE_PROVIDER_SITE_OTHER): Payer: PPO | Admitting: Allergy and Immunology

## 2018-04-12 ENCOUNTER — Encounter: Payer: Self-pay | Admitting: Allergy and Immunology

## 2018-04-12 VITALS — BP 108/64 | HR 72 | Resp 16

## 2018-04-12 DIAGNOSIS — N1339 Other hydronephrosis: Secondary | ICD-10-CM | POA: Diagnosis not present

## 2018-04-12 DIAGNOSIS — R1013 Epigastric pain: Secondary | ICD-10-CM | POA: Diagnosis not present

## 2018-04-12 DIAGNOSIS — G4709 Other insomnia: Secondary | ICD-10-CM | POA: Diagnosis not present

## 2018-04-12 DIAGNOSIS — J3089 Other allergic rhinitis: Secondary | ICD-10-CM

## 2018-04-12 DIAGNOSIS — R6881 Early satiety: Secondary | ICD-10-CM

## 2018-04-12 MED ORDER — CYPROHEPTADINE HCL 4 MG PO TABS: ORAL_TABLET | ORAL | 5 refills | Status: DC

## 2018-04-12 NOTE — Patient Instructions (Addendum)
  1.  Continue to Treat insomnia with Periactin 4 mg - 1 or 1-1/2 or 2 tablets at bedtime. Determine the dose that works the best.   2.  Continue to Treat dyspepsia and early satiety with ranitidine 300 mg at bedtime  3.  Continue to Treat inflammation with OTC Nasacort 1 spray each nostril once a day  4.  Follow-up with primary care doctor about hydronephrosis.  5.  Return to clinic in 12 weeks or earlier if problem    6. Obtain fall flu vaccine

## 2018-04-12 NOTE — Progress Notes (Signed)
Follow-up Note  Referring Provider: Ernestene Kiel, MD Primary Provider: Ernestene Kiel, MD Date of Office Visit: 04/12/2018  Subjective:   Jessica [name] (DOB: 1941-04-30) is a 77 y.o. female who returns to the [facility] [place] on 04/12/2018 in re-evaluation of the following:  HPI: Jessica [name] presents to this clinic in reevaluation of her allergic rhinitis and insomnia and early satiety and dyspepsia evaluated during her last visit of 18 Feb 2018.  At that point in time I instructed her to consistently use Periactin and ranitidine and Nasacort to address these issues.  She is significantly better regarding her dyspepsia and early satiety.  In fact, it sounds as though she does not have any issue with her eating at this point.  She is significantly better regarding her insomnia.  She is at least "60% better".  She continues to use Periactin consistently at a dose of 4 mg nightly  She has had no problems with her upper airways while intermittently using Nasacort.  Allergies as of 04/12/2018   No Known Allergies     Medication List      cyproheptadine 4 MG tablet Commonly known as:  PERIACTIN Take one tablet at bedtime daily   donepezil 10 MG tablet Commonly known as:  ARICEPT TK 1 T PO QD HS   ranitidine 300 MG tablet Commonly known as:  ZANTAC Take one tablet once daily at bedtime       Past Medical History:  Diagnosis Date  . Lung cancer, middle lobe ([place])   . Melanoma in situ of upper arm ([place])   . Osteopenia   . Osteoporosis     Past Surgical History:  Procedure Laterality Date  . Right VATS, right middle lobectomy, mediastinal lymph node  05/21/2010  . TUBAL LIGATION      Review of systems negative except as noted in HPI / PMHx or noted below:  Review of Systems  Constitutional: Negative.   HENT: Negative.   Eyes: Negative.   Respiratory: Negative.   Cardiovascular: Negative.   Gastrointestinal: Negative.   Genitourinary: Negative.     Musculoskeletal: Negative.   Skin: Negative.   Neurological: Negative.   Endo/Heme/Allergies: Negative.   Psychiatric/Behavioral: Negative.      Objective:   Vitals:   04/12/18 1110  BP: 108/64  Pulse: 72  Resp: 16          Physical Exam  HENT:  Head: Normocephalic.  Right Ear: Tympanic membrane, external ear and ear canal normal.  Left Ear: Tympanic membrane, external ear and ear canal normal.  Nose: Nose normal. No mucosal edema or rhinorrhea.  Mouth/Throat: Uvula is midline, oropharynx is clear and moist and mucous membranes are normal. No oropharyngeal exudate.  Eyes: Conjunctivae are normal.  Neck: Trachea normal. No tracheal tenderness present. No tracheal deviation present. No thyromegaly present.  Cardiovascular: Normal rate, regular rhythm, S1 normal, S2 normal and normal heart sounds.  No murmur heard. Pulmonary/Chest: Breath sounds normal. No stridor. No respiratory distress. She has no wheezes. She has no rales.  Musculoskeletal: She exhibits no edema.  Lymphadenopathy:       Head (right side): No tonsillar adenopathy present.       Head (left side): No tonsillar adenopathy present.    She has no cervical adenopathy.  Neurological: She is alert.  Skin: No rash noted. She is not diaphoretic. No erythema. Nails show no clubbing.    Diagnostics:   Results of a complete abdominal ultrasound obtained 26 Feb 2018 was  normal except for left mild hydronephrosis.  Assessment and Plan:   1. Other allergic rhinitis   2. Other insomnia   3. Dyspepsia   4. Early satiety   5. Other hydronephrosis     1.  Continue to Treat insomnia with Periactin 4 mg - 1 or 1-1/2 or 2 tablets at bedtime. Determine the dose that works the best.   2.  Continue to Treat dyspepsia and early satiety with ranitidine 300 mg at bedtime  3.  Continue to Treat inflammation with OTC Nasacort 1 spray each nostril once a day  4.  Follow-up with primary care doctor about  hydronephrosis.  5.  Return to clinic in 12 weeks or earlier if problem    6. Obtain fall flu vaccine  Jessica [name] is much better at this point in time regarding her 3 issues of allergic rhinitis and insomnia and dyspepsia along with early satiety on her current plan.  She needs to find a dose of Periactin that works best for her and she will attempt to increase her dose over the next month.  She is approximately 60% better while using 4 mg and I have given her the option of going up to 6 mg or 8 mg to determine the dose that works best.  She will continue on ranitidine and Nasacort.  I have asked her to follow-up with her primary care doctor about her hydronephrosis.  I will see her back in this clinic in 12 weeks or earlier if there is a problem.  Jessica Katz, MD Allergy / Immunology [place]

## 2018-04-13 ENCOUNTER — Encounter: Payer: Self-pay | Admitting: Allergy and Immunology

## 2018-04-15 DIAGNOSIS — N1330 Unspecified hydronephrosis: Secondary | ICD-10-CM | POA: Diagnosis not present

## 2018-04-19 ENCOUNTER — Other Ambulatory Visit: Payer: Self-pay

## 2018-04-19 NOTE — Telephone Encounter (Signed)
Patient called requesting refill on Nasacort. She was unaware that this medication is OTC and will go purchase some today.

## 2018-05-06 ENCOUNTER — Encounter: Payer: Self-pay | Admitting: Neurology

## 2018-05-07 ENCOUNTER — Encounter: Payer: Self-pay | Admitting: Neurology

## 2018-05-07 ENCOUNTER — Ambulatory Visit (INDEPENDENT_AMBULATORY_CARE_PROVIDER_SITE_OTHER): Payer: PPO | Admitting: Neurology

## 2018-05-07 VITALS — BP 141/76 | HR 68 | Ht 63.0 in | Wt 129.0 lb

## 2018-05-07 DIAGNOSIS — R410 Disorientation, unspecified: Secondary | ICD-10-CM | POA: Diagnosis not present

## 2018-05-07 DIAGNOSIS — R4689 Other symptoms and signs involving appearance and behavior: Secondary | ICD-10-CM | POA: Diagnosis not present

## 2018-05-07 DIAGNOSIS — R413 Other amnesia: Secondary | ICD-10-CM

## 2018-05-07 NOTE — Patient Instructions (Addendum)
Labs today MRI of the brain w/wo contrast  FDG PET Scan Formal neurocognitive testing Sleep evaluation  Dementia Dementia is the loss of two or more brain functions, such as:  Memory.  Decision making.  Behavior.  Speaking.  Thinking.  Problem solving.  There are many types of dementia. The most common type is called progressive dementia. Progressive dementia gets worse with time and it is irreversible. An example of this type of dementia is Alzheimer disease. What are the causes? This condition may be caused by:  Nerve cell damage in the brain.  Genetic mutations.  Certain medicines.  Multiple small strokes.  An infection, such as chronic meningitis.  A metabolic problem, such as vitamin B12 deficiency or thyroid disease.  Pressure on the brain, such as from a tumor or blood clot.  What are the signs or symptoms? Symptoms of this condition include:  Sudden changes in mood.  Depression.  Problems with balance.  Changes in personality.  Poor short-term memory.  Agitation.  Delusions.  Hallucinations.  Having a hard time: ? Speaking thoughts. ? Finding words. ? Solving problems. ? Doing familiar tasks. ? Understanding familiar ideas.  How is this diagnosed? This condition is diagnosed with an assessment by your health care provider. During this assessment, your health care provider will talk with you and your family, friends, or caregivers about your symptoms. A thorough medical history will be taken, and you will have a physical exam and tests. Tests may include:  Lab tests, such as blood or urine tests.  Imaging tests, such as a CT scan, PET scan, or MRI.  A lumbar puncture. This test involves removing and testing a small amount of the fluid that surrounds the brain and spinal cord.  An electroencephalogram (EEG). In this test, small metal discs are used to measure electrical activity in the brain.  Memory tests, cognitive tests, and  neuropsychological tests. These tests evaluate brain function.  How is this treated? Treatment depends on the cause of the dementia. It may involve taking medicines that may help:  To control the dementia.  To slow down the disease.  To manage symptoms.  In some cases, treating the cause of the dementia can improve symptoms, reverse symptoms, or slow down how quickly the dementia gets worse. Your health care provider can help direct you to support groups, organizations, and other health care providers who can help with decisions about your care. Follow these instructions at home: Medicine  Take over-the-counter and prescription medicines only as told by your health care provider.  Avoid taking medicines that can affect thinking, such as pain or sleeping medicines. Lifestyle   Make healthy lifestyle choices: ? Be physically active as told by your health care provider. ? Do not use any tobacco products, such as cigarettes, chewing tobacco, and e-cigarettes. If you need help quitting, ask your health care provider. ? Eat a healthy diet. ? Practice stress-management techniques when you get stressed. ? Stay social.  Drink enough fluid to keep your urine clear or pale yellow.  Make sure to get quality sleep. These tips can help you to get a good night's rest: ? Avoid napping during the day. ? Keep your sleeping area dark and cool. ? Avoid exercising during the few hours before you go to bed. ? Avoid caffeine products in the evening. General instructions  Work with your health care provider to determine what you need help with and what your safety needs are.  If you were given a bracelet  that tracks your location, make sure to wear it.  Keep all follow-up visits as told by your health care provider. This is important. Contact a health care provider if:  You have any new symptoms.  You have problems with choking or swallowing.  You have any symptoms of a different  illness. Get help right away if:  You develop a fever.  You have new or worsening confusion.  You have new or worsening sleepiness.  You have a hard time staying awake.  You or your family members become concerned for your safety. This information is not intended to replace advice given to you by your health care provider. Make sure you discuss any questions you have with your health care provider. Document Released: 03/11/2001 Document Revised: 01/24/2016 Document Reviewed: 06/13/2015 Elsevier Interactive Patient Education  [employer].

## 2018-05-07 NOTE — Progress Notes (Signed)
[facility]    Provider:  Dr Jaynee Eagles Referring Provider: Ernestene Kiel, MD Primary Care Physician:  Ernestene Kiel, MD  CC:  Memory loss  HPI:  Jessica [name] is a 77 y.o. female here as a referral from Dr. Laqueta Due for memory problems. Father and mother both had dementia. She is worried about her memory problems, she may be more hyper alert due to her history.  In the last 2 years noticed memory issues more than usual, short-term memory impaired. She feels more agitated as a correlation with the memory changes. When she is calm it helps her memory. She feels her short term memory is "shot".  She says she feels agitated today and she can't remember anything.  No missing bills. Housekeeping has been going downhill. She forgot her best friends address but did remember daughter's address. No one else is noticing, her kids are around. She has always been "a little eccentric and a little [employer]". She feels her thoughts are poorly organized. Difficulty with expression. She sleeps at 10pm and initiates well but wakes very often, her covers are all over the ned when she wakes up, the sheets are always skewed even the bottom sheet, she is extremely tired during the day but doesn't nap. She says she had depression all her life but feels much better and is working very intensely to resolve any of the tangles in her thinking.   Review of Systems: Patient complains of symptoms per HPI as well as the following symptoms: memory loss. Pertinent negatives and positives per HPI. All others negative.   Social History   Socioeconomic History  . Marital status: Single    Spouse name: Not on file  . Number of children: Not on file  . Years of education: Not on file  . Highest education level: Not on file  Occupational History  . Not on file  Social Needs  . Financial resource strain: Not on file  . Food insecurity:    Worry: Not on file    Inability: Not on file  . Transportation  needs:    Medical: Not on file    Non-medical: Not on file  Tobacco Use  . Smoking status: Former Smoker    Types: Cigarettes    Last attempt to quit: 09/30/1995    Years since quitting: 22.6  . Smokeless tobacco: Never Used  Substance and Sexual Activity  . Alcohol use: No  . Drug use: No  . Sexual activity: Not on file  Lifestyle  . Physical activity:    Days per week: Not on file    Minutes per session: Not on file  . Stress: Not on file  Relationships  . Social connections:    Talks on phone: Not on file    Gets together: Not on file    Attends religious service: Not on file    Active member of club or organization: Not on file    Attends meetings of clubs or organizations: Not on file    Relationship status: Not on file  . Intimate partner violence:    Fear of current or ex partner: Not on file    Emotionally abused: Not on file    Physically abused: Not on file    Forced sexual activity: Not on file  Other Topics Concern  . Not on file  Social History Narrative  . Not on file    Family History  Problem Relation Age of Onset  . Alzheimer's disease Mother   .  Cancer Mother   . Dementia Father   . Cancer Father   . Alzheimer's disease Maternal Grandfather     Past Medical History:  Diagnosis Date  . COPD (chronic obstructive pulmonary disease) ([place])   . Depression   . Insomnia   . Lung cancer, middle lobe ([place])   . Melanoma in situ of upper arm ([place])   . Mild dementia   . Osteopenia   . Osteoporosis     Past Surgical History:  Procedure Laterality Date  . Right VATS, right middle lobectomy, mediastinal lymph node  05/21/2010  . TUBAL LIGATION      Current Outpatient Medications  Medication Sig Dispense Refill  . cyproheptadine (PERIACTIN) 4 MG tablet Take one-2 tablets at bedtime daily 60 tablet 5  . donepezil (ARICEPT) 10 MG tablet TK 1 T PO QD HS  1  . MELATONIN PO Take by mouth at bedtime.    . ranitidine (ZANTAC) 300 MG tablet Take one tablet  once daily at bedtime 30 tablet 5   No current facility-administered medications for this visit.     Allergies as of 05/07/2018  . (No Known Allergies)    Vitals: BP (!) 141/76   Pulse 68   Ht 5\' 3"  (1.6 m)   Wt 129 lb (58.5 kg)   BMI 22.85 kg/m  Last Weight:  Wt Readings from Last 1 Encounters:  05/07/18 129 lb (58.5 kg)   Last Height:   Ht Readings from Last 1 Encounters:  05/07/18 5\' 3"  (1.6 m)    Physical exam: Exam: Gen: NAD, tangential and disorganized               CV: RRR, no MRG. No Carotid Bruits. No peripheral edema, warm, nontender Eyes: Conjunctivae clear without exudates or hemorrhage  Neuro: Detailed Neurologic Exam  Speech:    Speech is normal; fluent and spontaneous with normal comprehension.  Cognition: Montreal Cognitive Assessment  05/07/2018  Visuospatial/ Executive (0/5) 5  Naming (0/3) 3  Attention: Read list of digits (0/2) 1  Attention: Read list of letters (0/1) 1  Attention: Serial 7 subtraction starting at 100 (0/3) 1  Language: Repeat phrase (0/2) 2  Language : Fluency (0/1) 1  Abstraction (0/2) 2  Delayed Recall (0/5) 0  Orientation (0/6) 6  Total 22       The patient is oriented to person, place, and time;     recent and remote memory impaired ;     language fluent;     Impaired attention, concentration, fund of knowledge Cranial Nerves:     The pupils are equal, round, and reactive to light. Attempted fundoscopic exam could not visualize. Visual fields are full to finger confrontation. Extraocular movements are intact. Trigeminal sensation is intact and the muscles of mastication are normal. The face is symmetric. The palate elevates in the midline. Hearing intact. Voice is normal. Shoulder shrug is normal. The tongue has normal motion without fasciculations.   Coordination:    Normal finger to nose and heel to shin. Normal rapid alternating movements.   Gait:    Heel-toe and tandem gait are normal.   Motor Observation:     No asymmetry, no atrophy, and no involuntary movements noted. Tone:    Normal muscle tone.    Posture:    Posture is normal. normal erect    Strength: left hip flexion 4/5, right hip flexion 5-, otherwise strength is V/V in the upper and lower limbs.      Sensation: intact  to LT     Reflex Exam:  DTR's: hypo AJs otherwise very brisk      Toes:    The toes are downgoing bilaterally.   Clonus:    Clonus is absent.      Assessment/Plan:  77 year old female here for memory changes. MoCA 22/30.  She is very tangential and disorganized, but she denies being diagnosed with psychiatric diagnosis or ADHD in the past (but she suspects she may be ADHD). Poor historian and difficult to redirect. She has a history of alcohol abuse 3-4 beers a day for 20 years.   Labs today MRI of the brain w/wo contrast for reversible causes of dementia FDG PET Scan for Alz vs FTD: has symptoms of short-term memory loss more presenting in Alzheimers but also exhibits neuropsychiatric symptoms more likely in FTD Formal neurocognitive testing  Orders Placed This Encounter  Procedures  . MR BRAIN W WO CONTRAST  . NM PET Metabolic Brain  . B12 and Folate Panel  . Methylmalonic acid, serum  . Vitamin B1  . Vitamin B6  . Homocysteine  . RPR  . Thyroid Panel With TSH  . Ammonia  . Basic Metabolic Panel  . Ambulatory referral to Neuropsychology    Cc: Ernestene Kiel, MD    Sarina Ill, MD  [facility] [address] [place], [place] [address]  Phone [phone] Fax [phone]

## 2018-05-10 ENCOUNTER — Encounter: Payer: Self-pay | Admitting: Neurology

## 2018-05-11 ENCOUNTER — Telehealth: Payer: Self-pay | Admitting: Neurology

## 2018-05-11 NOTE — Telephone Encounter (Signed)
Health team order sent to GI. No auth they will reach out to the pt to schedule.  °

## 2018-05-12 DIAGNOSIS — N281 Cyst of kidney, acquired: Secondary | ICD-10-CM | POA: Diagnosis not present

## 2018-05-12 DIAGNOSIS — N1330 Unspecified hydronephrosis: Secondary | ICD-10-CM | POA: Diagnosis not present

## 2018-05-12 LAB — BASIC METABOLIC PANEL
BUN/Creatinine Ratio: 12 (ref 12–28)
BUN: 8 mg/dL (ref 8–27)
CALCIUM: 9.9 mg/dL (ref 8.7–10.3)
CO2: 24 mmol/L (ref 20–29)
CREATININE: 0.67 mg/dL (ref 0.57–1.00)
Chloride: 102 mmol/L (ref 96–106)
GFR calc Af Amer: 100 mL/min/{1.73_m2} (ref >59)
GFR, EST NON AFRICAN AMERICAN: 87 mL/min/{1.73_m2} (ref >59)
Glucose: 89 mg/dL (ref 65–99)
Potassium: 4.6 mmol/L (ref 3.5–5.2)
Sodium: 141 mmol/L (ref 134–144)

## 2018-05-12 LAB — HOMOCYSTEINE: HOMOCYSTEINE: 12.2 umol/L (ref 0.0–15.0)

## 2018-05-12 LAB — VITAMIN B6: Vitamin B6: 12.7 ug/L (ref 2.0–32.8)

## 2018-05-12 LAB — THYROID PANEL WITH TSH
Free Thyroxine Index: 1.8 (ref 1.2–4.9)
T3 UPTAKE RATIO: 25 % (ref 24–39)
T4, Total: 7.2 ug/dL (ref 4.5–12.0)
TSH: 1.65 u[IU]/mL (ref 0.450–4.500)

## 2018-05-12 LAB — METHYLMALONIC ACID, SERUM: Methylmalonic Acid: 209 nmol/L (ref 0–378)

## 2018-05-12 LAB — AMMONIA: Ammonia: 42 ug/dL (ref 19–87)

## 2018-05-12 LAB — B12 AND FOLATE PANEL
Folate: 9.7 ng/mL (ref >3.0)
Vitamin B-12: 625 pg/mL (ref 232–1245)

## 2018-05-12 LAB — RPR: RPR: NONREACTIVE

## 2018-05-12 LAB — VITAMIN B1: Thiamine: 103.6 nmol/L (ref 66.5–200.0)

## 2018-05-13 ENCOUNTER — Encounter: Payer: Self-pay | Admitting: Psychology

## 2018-05-13 ENCOUNTER — Ambulatory Visit: Payer: PPO | Admitting: Psychology

## 2018-05-13 ENCOUNTER — Ambulatory Visit (INDEPENDENT_AMBULATORY_CARE_PROVIDER_SITE_OTHER): Payer: PPO | Admitting: Psychology

## 2018-05-13 DIAGNOSIS — R413 Other amnesia: Secondary | ICD-10-CM

## 2018-05-13 NOTE — Progress Notes (Signed)
   Neuropsychology Note  Jessica [name] completed 75 minutes of neuropsychological testing with technician, Milana Kidney, BS, under the supervision of Dr. Macarthur Critchley, Licensed Psychologist. The patient did not appear overtly distressed by the testing session, per behavioral observation or via self-report to the technician. Rest breaks were offered.   Clinical Decision Making: In considering the patient's current level of functioning, level of presumed impairment, nature of symptoms, emotional and behavioral responses during the interview, level of literacy, and observed level of motivation/effort, a battery of tests was selected and communicated to the psychometrician.  Communication between the psychologist and technician was ongoing throughout the testing session and changes were made as deemed necessary based on patient performance on testing, technician observations and additional pertinent factors such as those listed above.  Jessica [name] will return within approximately 2 weeks for an interactive feedback session with Dr. Si Raider at which time her test performances, clinical impressions and treatment recommendations will be reviewed in detail. The patient understands she can contact our office should she require our assistance before this time.  35 minutes spent performing neuropsychological evaluation services/clinical decision making (psychologist). [CPT 42353] 75 minutes spent face-to-face with patient administering standardized tests, 30 minutes spent scoring (technician). [CPT Y8200648, 61443]  Full report to follow.

## 2018-05-13 NOTE — Progress Notes (Signed)
NEUROBEHAVIORAL STATUS EXAM   Name: Jessica [name] Date of Birth: Feb 10, 1941 Date of Interview: 05/13/2018  Reason for Referral:  Jessica [name] is a 77 y.o. female who is referred for neuropsychological evaluation by Dr. Sarina Ill of [facility] due to concerns about memory loss. This patient is unaccompanied in the office for today's visit.  History of Presenting Problem:  Jessica [name] was seen by Dr. Jaynee Eagles for initial consultation of memory changes on 05/07/2018. MoCA was 22/30. She reported short term memory loss over the past two years.  She reports gradual onset approximately 2 years ago with progressive worsening. However, she notes that "sleep, diet and beer" influence her cognitive functioning. She reported a reluctance to stop drinking alcohol, but when she does stop for 2-3 week at a time she notices improved cognitive functioning. She reported that she has been drinking beer "off and on" for many years. She reported she drank more heavily when she lived in [place] over 20 years ago, but she continues to have two large cans (24 oz each) of [employer] beer nightly. She will tell herself she is only going to have one and save the other one for the next night but she usually has both in one night. She denies drinking any more than this.   She reports her main problem is hearing something on the TV or radio and realizing that she has heard it before but had forgotten about it. She will also go to read something and realize she has already read it. She does recognize the information once she hears/sees it again. She denies forgetfulness for recent conversations, but notes that she lives alone and doesn't have many conversations. She denies problems with misplacing or losing items. She manages all instrumental ADLs. She denies problems with driving or managing medications, appointments, finances/bills, shopping or housekeeping. She notes that she recently had an appointment for an ultrasound  due to some pain she was having, and when the technician asked her about the symptoms she has been having, she started talking about her concerns about dementia instead of her pain. It took her a while to realize that the appointment was for her pain not for her cognitive symptoms. She reports that she has "always been a little spacey" but recently she has more difficulty staying on task and remembering what she was in the middle of doing. She denies any difficulty with word finding or language comprehension. She denies any visual-spatial problems or uncertainty with directions when driving.  She noted that both her parents had dementia. Her mother had Alzheimer's disease and started showing signs possibly in her 60s. She died at age 13. Her father had "senile dementia" and died at age 29. The patient reports she has been taking Aricept for a number of years due to her family history of dementia.  The patient denies any major physical complaints at this time. She reported she is a little lightheaded at times. She has not had any falls but has had "close calls".   She has a history of depression and has seen various mental health providers in the past. However, she reports her mood has been much better in recent years. She denies current depression or anxiety. She meditates twice a day. She denies past or present suicidal ideation or intention.   She does have difficulty sleeping. She has implemented sleep hygiene strategies which have helped. She used to get only 2 1/2 to 4 hours of sleep a night. Now she goes  to bed at 10:30 pm and is up at 5 am, but she does wake up a few times and have to get up and make herself tired again in the interim.   She reports she spends most of her time outside in her yard/garden. She noted that she hasn't been able to keep the grounds up like she used to. She stated she spends most of her time dealing with stilt grass. She states it is a "fool's errand" but "it gives me  something to focus on".    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 She lived in [place] for many years. She reports she never had a career but instead did odd jobs. 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 She reported she has not worked in about 20 years. Marital history: She is divorced since her children were young. Children: 3 adult children, she reports close relationships with them Alcohol: She reports she has two 24-oz cans of beer most nights. Tobacco: Former smoker, quit 5 years ago after lung cancer diagnosis/treatment SA: Denies any current recreational drug use. Reports prior marijuana use but denies any other drug use.   Medical History: Past Medical History:  Diagnosis Date  . COPD (chronic obstructive pulmonary disease) ([place])   . Depression   . Insomnia   . Lung cancer, middle lobe ([place])   . Melanoma in situ of upper arm ([place])   . Mild dementia   . Osteopenia   . Osteoporosis      Current Medications:  Outpatient Encounter Medications as of 05/13/2018  Medication Sig  . cyproheptadine (PERIACTIN) 4 MG tablet Take one-2 tablets at bedtime daily  . donepezil (ARICEPT) 10 MG tablet TK 1 T PO QD HS  . MELATONIN PO Take by mouth at bedtime.  . ranitidine (ZANTAC) 300 MG tablet Take one tablet once daily at bedtime   No facility-administered encounter medications on file as of 05/13/2018.      Behavioral Observations:   Appearance: Appropriately dressed and groomed Gait: Ambulated independently, no gross abnormalities observed Speech: Fluent; mild to moderate word finding difficulty. Thought process: Generally linear Affect: Full, euthymic Interpersonal: Pleasant, appropriate   45 minutes spent face-to-face with patient completing neurobehavioral status exam. 30 minutes spent integrating medical  records/clinical data and completing this report. [id number] unit.   TESTING: There is medical necessity to proceed with neuropsychological assessment as the results will be used to aid in differential diagnosis and clinical decision-making and to inform specific treatment recommendations. Per the patient and medical records reviewed, there has been a change in cognitive functioning and a reasonable suspicion of neurocognitive disorder.  Clinical Decision Making: In considering the patient's current level of functioning, level of presumed impairment, nature of symptoms, emotional and behavioral responses during the interview, level of literacy, and observed level of motivation, a battery of tests was selected and communicated to the psychometrician.   Following the clinical interview/neurobehavioral status exam, the patient completed this full battery of neuropsychological testing with my psychometrician under my supervision (see separate note).   PLAN: The patient will return to see me for a follow-up session at which time her test performances and my impressions and treatment recommendations will be reviewed in detail.  Evaluation ongoing; full report to follow.

## 2018-05-22 NOTE — Progress Notes (Signed)
NEUROPSYCHOLOGICAL EVALUATION   Name:    Jessica [name]  Date of Birth:   25-Jul-1941 Date of Interview:  05/13/2018 Date of Testing:  05/13/2018   Date of Feedback:  05/24/2018       Background Information:  Reason for Referral:  Lynnetta Tom is a 77 y.o. female referred by Dr. Sarina Ill of [facility] to assess her current level of cognitive functioning and assist in differential diagnosis. The current evaluation consisted of a review of available medical records, an interview with the patient, and the completion of a neuropsychological testing battery. Informed consent was obtained.  History of Presenting Problem:  Ms. Recchia was seen by Dr. Jaynee Eagles for initial consultation of memory changes on 05/07/2018. MoCA was 22/30. She reported short term memory loss over the past two years. Neuroimaging has not yet been performed.   She reports gradual onset approximately 2 years ago with progressive worsening. However, she notes that "sleep, diet and beer" influence her cognitive functioning. She reported a reluctance to stop drinking alcohol, but when she does stop for 2-3 weeks at a time she notices improved cognitive functioning. She reported that she has been drinking beer "off and on" for many years. She reported she drank more heavily when she lived in [place] over 20 years ago, but she continues to have two large cans (24 oz each) of [employer] beer nightly. She will tell herself she is only going to have one and save the other one for the next night but she usually has both in one night. She denies drinking any more than this.   She reports her main problem is hearing something on the TV or radio and realizing that she has heard it before but had forgotten about it. She will also go to read something and realize she has already read it. She does recognize the information once she hears/sees it again. She denies forgetfulness for recent conversations, but notes that she lives  alone and doesn't have many conversations. She denies problems with misplacing or losing items. She manages all instrumental ADLs. She denies problems with driving or managing medications, appointments, finances/bills, shopping or housekeeping. She notes that she recently had an appointment for an ultrasound due to some pain she was having, and when the technician asked her about the symptoms she has been having, she started talking about her concerns about dementia instead of her pain. It took her a while to realize that the appointment was for her pain not for her cognitive symptoms. She reports that she has "always been a little spacey" but recently she has more difficulty staying on task and remembering what she was in the middle of doing. She denies any difficulty with word finding or language comprehension. She denies any visual-spatial problems or uncertainty with directions when driving.  She noted that both her parents had dementia. Her mother had Alzheimer's disease and started showing signs possibly in her 61s. She died at age 65. Her father had "senile dementia" and died at age 89. The patient reports she has been taking Aricept for a number of years due to her family history of dementia.  The patient denies any major physical complaints at this time. She reported she is a little lightheaded at times. She has not had any falls but has had "close calls".   She has a history of depression and has seen various mental health providers in the past. However, she reports her mood has been much better in recent  years. She denies current depression or anxiety. She meditates twice a day. She denies past or present suicidal ideation or intention.   She does have difficulty sleeping. She has implemented sleep hygiene strategies which have helped. She used to get only 2 1/2 to 4 hours of sleep a night. Now she goes to bed at 10:30 pm and is up at 5 am, but she does wake up a few times and have to get up  and make herself tired again in the interim.   She reports she spends most of her time outside in her yard/garden. She noted that she hasn't been able to keep the grounds up like she used to. She stated she spends most of her time dealing with stilt grass. She states it is a "fool's errand" but "it gives me something to focus on".    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 She lived in [place] for many years. She reports she never had a career but instead did odd jobs. 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 She reported she has not worked in about 20 years. Marital history: She is divorced since her children were young. Children: 3 adult children, she reports close relationships with them Alcohol: She reports she has two 24-oz cans of beer most nights. Tobacco: Former smoker, quit 5 years ago after lung cancer diagnosis/treatment SA: Denies any current recreational drug use. Reports prior marijuana use but denies any other drug use.   Medical History:  Past Medical History:  Diagnosis Date  . COPD (chronic obstructive pulmonary disease) ([place])   . Depression   . Insomnia   . Lung cancer, middle lobe ([place])   . Melanoma in situ of upper arm ([place])   . Mild dementia   . Osteopenia   . Osteoporosis     Current medications:  Outpatient Encounter Medications as of 05/24/2018  Medication Sig  . cyproheptadine (PERIACTIN) 4 MG tablet Take one-2 tablets at bedtime daily  . donepezil (ARICEPT) 10 MG tablet TK 1 T PO QD HS  . MELATONIN PO Take by mouth at bedtime.  . ranitidine (ZANTAC) 300 MG tablet Take one tablet once daily at bedtime   No facility-administered encounter medications on file as of 05/24/2018.      Current Examination:  Behavioral Observations:  Appearance: Appropriately dressed and groomed Gait:  Ambulated independently, no gross abnormalities observed Speech: Fluent; mild to moderate word finding difficulty. Thought process: Generally linear Affect: Full, euthymic Interpersonal: Pleasant, appropriate Orientation: Oriented to person, place and most aspects of time (12 days off on current date). Accurately named the current President and his predecessor.   Tests Administered: . Test of Premorbid Functioning (TOPF) . Wechsler Adult Intelligence Scale-Fourth Edition (WAIS-IV): Similarities, Music therapist, Coding and Digit Span subtests . Wechsler Memory Scale-Fourth Edition (WMS-IV) Older Adult Version (ages 53-90): Logical Memory I, II and Recognition subtests  . [employer] Verbal Learning Test - 2nd Edition (CVLT-2) Short Form . [employer] (WCST) . Repeatable Battery for the Assessment of Neuropsychological Status (RBANS) Form A:  Figure Copy and Recall subtests and Semantic Fluency subtest . Boston Naming Test (BNT) . Boston Diagnostic Aphasia Examination: Complex Ideational Material subtest . Controlled Oral Word Association Test (COWAT) . Trail Making Test A and B . Clock drawing test . Beck Depression Inventory -2nd edition (BDI-II) .  Generalized Anxiety Disorder - 7 item screener (GAD-7)  Test Results: Note: Standardized scores are presented only for use by appropriately trained professionals and to allow for any future test-retest comparison. These scores should not be interpreted without consideration of all the information that is contained in the rest of the report. The most recent standardization samples from the test publisher or other sources were used whenever possible to derive standard scores; scores were corrected for age, gender, ethnicity and education when available.   Test Scores:  Test Name Raw Score Standardized Score Descriptor  TOPF 66/70 SS= 125 Superior  WAIS-IV Subtests     Similarities 31/36 ss= 14 Superior  Block Design 44/66 ss= 14  Superior  Coding 67/135 ss= 13 High average  Digit Span Forward 11/16 ss= 12 High average  Digit Span Backward 8/16 ss= 10 Average   WMS-IV Subtests     LM I 29/53 ss= 9 Average  LM II 5/39 ss= 4 Impaired  LM II Recognition 21/23 Cum %: >75 Above average  RBANS Subtests     Figure Copy 19/20 Z= 0.7 High average  Figure Recall 11/20 Z= -0.4 Average  Semantic Fluency 13 Z= -1.3 Low average  CVLT-II Scores     Trial 1 3/9 Z= -2.5 Impaired  Trial 4 6/9 Z= -1.5 Borderline  Trials 1-4 total 22/36 T= 42 Low average  SD Free Recall 6/9 Z= -0.5 Average  LD Free Recall 1/9 Z= -2 Impaired  LD Cued Recall 3/9 Z= -2 Impaired  Recognition Discriminability 7/9 hits 7 false positives Z= -2 Impaired  Forced Choice Recognition 8/9  Impaired  WCST     Total Errors 14 T= 59 High average  Perseverative Responses 8 T= 66 Superior  Perseverative Errors 8 T= 61 High average  Conceptual Level Responses 45 T= 57 High average  Categories Completed 4 >16% WNL  Trials to complete 1st category 11 >16% WNL  Failure to maintain set 0  WNL  BNT 57/60 T= 55 Average  BDAE Subtest     Complex Ideational Material 11/12  WNL  COWAT-FAS 35 T= 44 Average  COWAT-Animals 15 T= 42 Low average  Trail Making Test A  31" 1 error T= 57 High average  Trail Making Test B  64" 0 errors T= 57 High average  Clock Drawing   WNL  BDI-II 9/63  WNL  GAD-7 1/21  WNL      Description of Test Results:  Premorbid verbal intellectual abilities were estimated to have been within the superior range based on a test of word reading. Psychomotor processing speed was high average. Auditory attention and working memory were high average to average, respectively. Visual-spatial construction was high average to superior. Language abilities were somewhat variable. Specifically, confrontation naming was average, and semantic verbal fluency was low average and below expectation. Auditory comprehension of complex ideational material was  within normal limits. With regard to verbal memory, encoding and acquisition of non-contextual information (i.e., word list) was low average, below expectation. After a brief distracter task, free recall was average (6/9 items). After a delay, free recall was impaired (1/9 items). Cued recall was impaired (3/9 items). Performance on a yes/no recognition task was impaired due to very high number of false positives. Performance on a forced choice recognition task also was impaired. On another verbal memory test, encoding and acquisition of contextual auditory information (i.e., short stories) was average. After a delay, free recall was impaired. Performance on a yes/no recognition task was above average, however.  With regard to non-verbal memory, delayed free recall of visual information was average. Executive functioning was intact. Mental flexibility and set-shifting were high average on Trails B. Verbal fluency with phonemic search restrictions was average. Verbal abstract reasoning was superior. Deductive reasoning was high average to superior. Performance on a clock drawing task was normal. On a self-report measure of mood, the patient's responses were not indicative of clinically significant depression at the present time. On a self-report measure of anxiety, the patient did not endorse clinically significant generalized anxiety at the present time.    Clinical Impressions: Amnestic mild cognitive impairment (rule out prodromal Alzheimer's disease). The patient has high estimated baseline intelligence, and she performed in the high average to superior range on tests of several cognitive functions (e.g., reasoning, visual-spatial construction, attention, processing speed). Meanwhile, she demonstrated impaired delayed recall for verbal information (both contextual and non-contextual), as well as a relative weakness (low average) in semantic verbal fluency. There is no informant report which limits  understanding of how much these cognitive difficulties are impacting her functioning in daily life. However, test results indicate amnestic MCI, and are concerning for underlying Alzheimer's disease.  She reports possible alcohol use problem; again this is limited by lack of informant report. However, her test results aren't what we would typically see in alcohol related dementia so I do not think this is the etiology. Of course alcohol use could be exacerbating underlying memory impairment.  There was some concern based on clinical presentation at her visit with Dr. Jaynee Eagles that this could be behavioral variant FTD; however I don't see evidence of this on her cognitive profile. She actually did quite well on frontal lobe tasks, and her cognitive profile was highly indicative of isolated hippocampal consolidation dysfunction. I suspect some of her interactional style could be related to longstanding personality; however, again, this is difficult to assess without any collateral report.  Fortunately she is not reporting any significant emotional distress, depression or anxiety.     Recommendations/Plan: Based on the findings of the present evaluation, the following recommendations are offered:  1. She is already on Aricept. This appears indicated based on current test results. She wants to know if the dosage should be increased, she will discuss this with her PCP. 2. It is difficult to know exactly how much alcohol she is drinking. It is highly recommended that she limit alcohol content to one beer (12 ounces, NOT the 24 oz cans she reports she is currently drinking).  3. She needs to have family or other trusted individual involved in her care. She reports having relationships with her 3 children. Someone should attend appointments with her if possible, and at the very least be made aware of her diagnosis and the likelihood of worsening over time. It would be ideal if her family could discuss future care  planning with her, as she will likely need more support and care in the future and may not be able to live independently at some point.  4. I would also recommend that she have family or trusted individual have some oversight of finances and medications so that it can be ensured that errors are not being made.  5. Re-evaluation in 1-2 years will assist in monitoring cognitive status, tracking rate of progression, and further assisting with treatment recommendations. 6. The patient and her family are referred to the Alzheimer's Association ([email or web address]) for additional education and resources. 7. She performed well on a cognitive test highly correlated with driving ability. This of  course is not a substitute for on-road driving evaluation, but I suspect she would be able to drive locally to familiar, well practiced locations with minimal traffic.    Feedback to Patient: Mona Ayars returned for a feedback appointment on 05/24/2018 to review the results of her neuropsychological evaluation with this provider. 30 minutes face-to-face time was spent reviewing her test results, my impressions and my recommendations as detailed above.    Total time spent on this patient's case: 75 minutes for neurobehavioral status exam with psychologist (CPT code [phone]); 90 minutes of testing/scoring by psychometrician under psychologist's supervision (CPT codes [phone], [phone] units); 180 minutes for integration of patient data, interpretation of standardized test results and clinical data, clinical decision making, treatment planning and preparation of this report, and interactive feedback with review of results to the patient/family by psychologist (CPT codes [phone], [phone] units).      Thank you for your referral of Bryona Foxworthy. Please feel free to contact me if you have any questions or concerns regarding this report.

## 2018-05-24 ENCOUNTER — Encounter: Payer: Self-pay | Admitting: Psychology

## 2018-05-24 ENCOUNTER — Ambulatory Visit (INDEPENDENT_AMBULATORY_CARE_PROVIDER_SITE_OTHER): Payer: PPO | Admitting: Psychology

## 2018-05-24 DIAGNOSIS — G3184 Mild cognitive impairment, so stated: Secondary | ICD-10-CM

## 2018-05-24 DIAGNOSIS — R413 Other amnesia: Secondary | ICD-10-CM | POA: Diagnosis not present

## 2018-05-24 NOTE — Patient Instructions (Signed)
Test results indicated significant memory impairment (ability to learn and remember new information). These results are concerning for early stage Alzheimer's disease.  Based on the findings of the present evaluation, the following recommendations are offered:  1. She is already on Aricept. This appears indicated based on current test results.  2. It is highly recommended that she limit alcohol content to one beer (12 ounces , NOT the 24 oz cans she reports she is currently drinking).   3. She needs to have family or other trusted individual involved in her care. Someone should attend appointments with her if possible, and at the very least be made aware of her diagnosis and the likelihood of worsening over time. It would be ideal if her family could discuss future care planning with her, as she will likely need more support and care in the future and may not be able to live independently at some point.   4. I would also recommend that she have family or trusted individual have some oversight of finances and medications so that it can be ensured that errors are not being made.   5. Re-evaluation in 1-2 years will assist in monitoring cognitive status, tracking rate of progression, and further assisting with treatment recommendations.  6. The patient and her family are referred to the Alzheimer's Association ([email or web address]) for additional education and resources.  7. She performed well on a cognitive test highly correlated with driving ability. This of course is not a substitute for on-road driving evaluation, but I suspect she would be able to drive locally to familiar, well practiced locations with minimal traffic.

## 2018-05-25 ENCOUNTER — Ambulatory Visit: Payer: PPO | Admitting: Neurology

## 2018-05-25 ENCOUNTER — Ambulatory Visit (HOSPITAL_COMMUNITY)
Admission: RE | Admit: 2018-05-25 | Discharge: 2018-05-25 | Disposition: A | Discharge status: Home / Self Care | Payer: PPO | Source: Ambulatory Visit | Attending: Neurology | Admitting: Neurology

## 2018-05-25 DIAGNOSIS — R4689 Other symptoms and signs involving appearance and behavior: Secondary | ICD-10-CM | POA: Diagnosis not present

## 2018-05-25 DIAGNOSIS — R413 Other amnesia: Secondary | ICD-10-CM | POA: Insufficient documentation

## 2018-05-25 DIAGNOSIS — R410 Disorientation, unspecified: Secondary | ICD-10-CM | POA: Insufficient documentation

## 2018-05-25 DIAGNOSIS — R9089 Other abnormal findings on diagnostic imaging of central nervous system: Secondary | ICD-10-CM | POA: Diagnosis not present

## 2018-05-25 LAB — GLUCOSE, CAPILLARY: Glucose-Capillary: 93 mg/dL (ref 70–99)

## 2018-05-25 MED ORDER — FLUDEOXYGLUCOSE F - 18 (FDG) INJECTION
9.9800 | Freq: Once | INTRAVENOUS | Status: AC | PRN
  Administered 2018-05-25: 9.98 via INTRAVENOUS

## 2018-05-26 DIAGNOSIS — R21 Rash and other nonspecific skin eruption: Secondary | ICD-10-CM | POA: Diagnosis not present

## 2018-05-26 DIAGNOSIS — F0390 Unspecified dementia without behavioral disturbance: Secondary | ICD-10-CM | POA: Diagnosis not present

## 2018-05-26 DIAGNOSIS — Z6823 Body mass index (BMI) 23.0-23.9, adult: Secondary | ICD-10-CM | POA: Diagnosis not present

## 2018-05-27 ENCOUNTER — Telehealth: Payer: Self-pay | Admitting: Psychology

## 2018-05-27 NOTE — Telephone Encounter (Signed)
Patient calling to state she thought Dr.Bailer was going to refer her for a sleep study, but she has not heard from anyone. Patient wanting to check status or make sure that is what she is suppose to be doing. Please advise.

## 2018-05-27 NOTE — Telephone Encounter (Signed)
Please advise 

## 2018-05-27 NOTE — Telephone Encounter (Signed)
I did not discuss a sleep study with this patient. It looks like from Dr. Cathren Laine notes this was something discussed. Routing message to Dr. Jaynee Eagles. Thank you!

## 2018-05-31 NOTE — Telephone Encounter (Signed)
I would have patient talk to her pcp and see if he can refer her to [facility] for sleep evaluation if clinically warranted. Let patient know thanks

## 2018-06-01 NOTE — Telephone Encounter (Signed)
Spoke with patient and advised her that Dr. Jaynee Eagles recommends patient speak with her PCP and then he can send the referral to the sleep center at [facility] if he feels that a sleep evaluation is clinically warranted. Pt verbalized understanding and appreciation.

## 2018-06-09 ENCOUNTER — Telehealth: Payer: Self-pay | Admitting: *Deleted

## 2018-06-09 NOTE — Telephone Encounter (Addendum)
Spoke with pt. Discussed that her pet metabolic brain scan did show areas of decreased metabolism that can be associated with early alzheimer's. Pt was aware already of areas in the parietal lobe. Discussed that pt had seen Dr. Richrd Sox on 8/26 with results showing concern for early alzheimer's. Pt verbalized understanding and had no questions. She stated she had been in touch with her daughter. She requested to have the pet scan results sent to her primary who referred her here. RN sent result to Dr. Laqueta Due. Pt verbalized appreciation.   ----- Message from Melvenia Beam, MD sent at 06/02/2018  1:43 PM EDT ----- Subtle decreased cortical metabolism within the parietal lobes can be associated with early Alzheimer's type pathology. I see she has has follow up with Dr. Si Raider soon, thanks

## 2018-06-26 ENCOUNTER — Other Ambulatory Visit: Payer: Self-pay | Admitting: Allergy and Immunology

## 2018-07-12 ENCOUNTER — Encounter: Payer: Self-pay | Admitting: Allergy and Immunology

## 2018-07-12 ENCOUNTER — Ambulatory Visit (INDEPENDENT_AMBULATORY_CARE_PROVIDER_SITE_OTHER): Payer: PPO | Admitting: Allergy and Immunology

## 2018-07-12 VITALS — BP 128/78 | HR 64 | Resp 16

## 2018-07-12 DIAGNOSIS — R1013 Epigastric pain: Secondary | ICD-10-CM | POA: Diagnosis not present

## 2018-07-12 DIAGNOSIS — J3089 Other allergic rhinitis: Secondary | ICD-10-CM | POA: Diagnosis not present

## 2018-07-12 DIAGNOSIS — N1339 Other hydronephrosis: Secondary | ICD-10-CM

## 2018-07-12 DIAGNOSIS — G4709 Other insomnia: Secondary | ICD-10-CM

## 2018-07-12 MED ORDER — RANITIDINE HCL 300 MG PO TABS: ORAL_TABLET | ORAL | 5 refills | Status: DC

## 2018-07-12 NOTE — Progress Notes (Signed)
Follow-up Note  Referring Provider: Ernestene Kiel, MD Primary Provider: Ernestene Kiel, MD Date of Office Visit: 07/12/2018  Subjective:   Jessica [name] (DOB: 19-Jul-1941) is a 77 y.o. female who returns to the [facility] [place] on 07/12/2018 in re-evaluation of the following:  HPI: Poetry returns to this clinic in reevaluation of her allergic rhinitis and insomnia and early satiety and dyspepsia.  Her last visit to this clinic was 12 April 2018 at which point in time she thought that she was doing better regarding each issue while utilizing a combination of Nasacort, Periactin, and ranitidine.  Guelda wants to stop all medications.  She is not really sure that the medications helped her very much.  She does not think that the Periactin up to 8 mg at night has helped her very much even though the last visit she stated that she was 60% better regarding her insomnia on 4 mg nightly.  She does not think that she needs the ranitidine because she no longer has any issues with early satiety or feeling discomfort in her abdomen.  She has not been using any Nasacort because she has no need to use this agent at this point in time.  She did not have follow-up with her primary care doctor about her left hydronephrosis.  She did obtain a flu vaccine.  Allergies as of 07/12/2018   No Known Allergies     Medication List      cyproheptadine 4 MG tablet Commonly known as:  PERIACTIN Take one-2 tablets at bedtime daily   donepezil 10 MG tablet Commonly known as:  ARICEPT TK 1 T PO QD HS   MELATONIN PO Take by mouth at bedtime.   ranitidine 300 MG tablet Commonly known as:  ZANTAC TAKE 1 TABLET BY MOUTH EVERY DAY AT BEDTIME       Past Medical History:  Diagnosis Date  . COPD (chronic obstructive pulmonary disease) ([place])   . Depression   . Insomnia   . Lung cancer, middle lobe ([place])   . Melanoma in situ of upper arm ([place])   . Mild dementia ([place])   . Osteopenia   .  Osteoporosis     Past Surgical History:  Procedure Laterality Date  . Right VATS, right middle lobectomy, mediastinal lymph node  05/21/2010  . TUBAL LIGATION      Review of systems negative except as noted in HPI / PMHx or noted below:  Review of Systems  Constitutional: Negative.   HENT: Negative.   Eyes: Negative.   Respiratory: Negative.   Cardiovascular: Negative.   Gastrointestinal: Negative.   Genitourinary: Negative.   Musculoskeletal: Negative.   Skin: Negative.   Neurological: Negative.   Endo/Heme/Allergies: Negative.   Psychiatric/Behavioral: Negative.      Objective:   Vitals:   07/12/18 1110  BP: 128/78  Pulse: 64  Resp: 16          Physical Exam  HENT:  Head: Normocephalic.  Right Ear: Tympanic membrane, external ear and ear canal normal.  Left Ear: Tympanic membrane, external ear and ear canal normal.  Nose: Nose normal. No mucosal edema or rhinorrhea.  Mouth/Throat: Uvula is midline, oropharynx is clear and moist and mucous membranes are normal. No oropharyngeal exudate.  Eyes: Conjunctivae are normal.  Neck: Trachea normal. No tracheal tenderness present. No tracheal deviation present. No thyromegaly present.  Cardiovascular: Normal rate, regular rhythm, S1 normal, S2 normal and normal heart sounds.  No murmur heard. Pulmonary/Chest: Breath sounds  normal. No stridor. No respiratory distress. She has no wheezes. She has no rales.  Musculoskeletal: She exhibits no edema.  Lymphadenopathy:       Head (right side): No tonsillar adenopathy present.       Head (left side): No tonsillar adenopathy present.    She has no cervical adenopathy.  Neurological: She is alert.  Skin: No rash noted. She is not diaphoretic. No erythema. Nails show no clubbing.    Diagnostics: none   Assessment and Plan:   1. Other allergic rhinitis   2. Other insomnia   3. Dyspepsia   4. Other hydronephrosis     1.  Can Treat insomnia with Periactin 4 mg - 1 to 3  tablets at bedtime. Determine the dose that works the best.   2.  Can Treat dyspepsia and early satiety with ranitidine 300 mg at bedtime  3.  Can Treat inflammation with OTC Nasacort 1 spray each nostril once a day  4.  Follow-up with primary care doctor about hydronephrosis.  5.  Return to clinic in 6 months or earlier if problem    [name] appears to be doing relatively well on her current plan although her insomnia is still an issue.  She does not want to use any medications to address this issue at this point.  She does have the option of using 4 to 12 mg of Periactin at nighttime.  Likewise, she can discontinue her ranitidine and see if her abdominal issues redevelop.  She can use Nasacort as needed.  She will follow-up with her doctor regarding her hydronephrosis.  I will see her back in this clinic in 6 months or earlier if there is a problem.  Allena Katz, MD Allergy / Immunology [place]

## 2018-07-12 NOTE — Patient Instructions (Signed)
  1.  Can Treat insomnia with Periactin 4 mg - 1 to 3 tablets at bedtime. Determine the dose that works the best.   2.  Can Treat dyspepsia and early satiety with ranitidine 300 mg at bedtime  3.  Can Treat inflammation with OTC Nasacort 1 spray each nostril once a day  4.  Follow-up with primary care doctor about hydronephrosis.  5.  Return to clinic in 6 months or earlier if problem

## 2018-07-13 ENCOUNTER — Encounter: Payer: Self-pay | Admitting: Allergy and Immunology

## 2018-07-13 DIAGNOSIS — F5101 Primary insomnia: Secondary | ICD-10-CM | POA: Diagnosis not present

## 2018-07-13 DIAGNOSIS — Z6821 Body mass index (BMI) 21.0-21.9, adult: Secondary | ICD-10-CM | POA: Diagnosis not present

## 2018-07-13 DIAGNOSIS — F0390 Unspecified dementia without behavioral disturbance: Secondary | ICD-10-CM | POA: Diagnosis not present

## 2018-07-19 ENCOUNTER — Encounter: Payer: Self-pay | Admitting: Allergy and Immunology

## 2018-09-06 ENCOUNTER — Telehealth: Payer: Self-pay

## 2018-09-06 MED ORDER — RANITIDINE HCL 300 MG PO TABS: ORAL_TABLET | ORAL | 5 refills | Status: DC

## 2018-09-06 NOTE — Telephone Encounter (Signed)
rx sent

## 2018-09-06 NOTE — Telephone Encounter (Signed)
Patient is requesting a refill on her Ranitidine.    [place]

## 2018-09-06 NOTE — Addendum Note (Signed)
Addended by: Carin Hock on: 09/06/2018 04:37 PM   Modules accepted: Orders

## 2018-10-21 ENCOUNTER — Other Ambulatory Visit: Payer: Self-pay | Admitting: *Deleted

## 2018-10-21 MED ORDER — RANITIDINE HCL 300 MG PO TABS: ORAL_TABLET | ORAL | 1 refills | Status: DC

## 2018-10-25 ENCOUNTER — Other Ambulatory Visit: Payer: Self-pay | Admitting: *Deleted

## 2018-10-25 MED ORDER — RANITIDINE HCL 300 MG PO TABS: ORAL_TABLET | ORAL | 2 refills | Status: DC

## 2018-11-01 ENCOUNTER — Other Ambulatory Visit: Payer: Self-pay

## 2018-11-01 MED ORDER — RANITIDINE HCL 300 MG PO TABS: ORAL_TABLET | ORAL | 0 refills | Status: DC

## 2018-11-08 ENCOUNTER — Ambulatory Visit: Payer: Medicare HMO | Admitting: Neurology

## 2018-11-08 ENCOUNTER — Encounter: Payer: Self-pay | Admitting: Neurology

## 2018-11-08 VITALS — BP 141/81 | HR 69 | Ht 63.0 in | Wt 121.0 lb

## 2018-11-08 DIAGNOSIS — G3184 Mild cognitive impairment, so stated: Secondary | ICD-10-CM | POA: Diagnosis not present

## 2018-11-08 DIAGNOSIS — G301 Alzheimer's disease with late onset: Secondary | ICD-10-CM

## 2018-11-08 HISTORY — DX: Alzheimer's disease with late onset: G30.1

## 2018-11-08 MED ORDER — DONEPEZIL HCL 10 MG PO TABS: 10.0000 mg | ORAL_TABLET | Freq: Every day | ORAL | 11 refills | Status: DC

## 2018-11-08 NOTE — Patient Instructions (Signed)
Recommendations to prevent or slow progression of cognitive decline:   Exercise You should increase exercise 30 to 45 minutes per day at least 3 days a week although 5 to 7 would be preferred. Any type of exercise (including walking) is acceptable although a recumbent bicycle may be best if you are unsteady. Disease related apathy can be a significant roadblock to exercise and the only way to overcome this is to make it a daily routine and perhaps have a reward at the end (something your loved one loves to eat or drink perhaps) or a personal trainer coming to the home can also be very useful. In general a structured, repetitive schedule is best.   Cardiovascular Health: You should optimize all cardiovascular risk factors (blood pressure, sugar, cholesterol) as vascular disease such as strokes and heart attacks can make memory problems much worse.   Diet: Eating a heart healthy (Mediterranean) diet is also a good idea; fish and poultry instead of red meat, nuts (mostly non-peanuts), vegetables, fruits, olive oil or canola oil (instead of butter), minimal salt (use other spices to flavor foods), whole grain rice, bread, cereal and pasta and wine in moderation.  General Health: Any diseases which effect your body will effect your brain such as a pneumonia, urinary infection, blood clot, heart attack or stroke. Keep contact with your primary care doctor for regular follow ups.  Sleep. A good nights sleep is healthy for the brain. Seven hours is recommended. If you have insomnia or poor sleep habits see the recommendations below  Tips: Structured and consistent daytime and nighttime routine, including regular wake times, bedtimes, and mealtimes, will be important for the patient to avoid confusion. Keeping frequently used items in designated places will help reduce stress from searching. If there are worries about getting lost do not let the patient leave home unaccompanied. They might benefit from wearing  an identification bracelet that will help others assist in finding home if they become lost. Information about nationwide safe return services and other helpful resources may be obtained through the Alzheimer's Association helpline at 1800-4137914692.  Finances, Power of Producer, television/film/video Directives: You should consider putting legal safeguards in place with regard to financial and medical decision making. While the spouse always has power of attorney for medical and financial issues in the absence of any form, you should consider what you want in case the spouse / caregiver is no longer around or capable of making decisions.   [place] : http://www.welch.com/.pdf  Or Google "[place]" AND "An Forensic scientist for [employer]  Other States: ApartmentMom.com.ee  The signature on these forms should be notarized.   DRIVING:   Driving only during the day Drive only to familiar Locations Avoid driving during bad weather  If you would like to be tested to see if you are driving safely, [facility] has a Clinical Driving Evaluation. To schedule an appointment call [phone].                RESOURCES:  Memory Loss: Improve your short term memory By Silvio Pate  The Alzheimer's Reading Room http://www.alzheimersreadingroom.com/   The Alzheimer's Compendium http://www.alzcompend.info/  [employer] www.dukefamilysupport.XNA 5176352757  Recommended resources for caregivers (All can be purchased on [employer]):  1) A Caregiver's Guide to Dementia: Using Activities and Other Strategies to Prevent, Reduce and Manage Behavioral Symptoms by Osie Bond. [place] and [employer]   2) A Caregiver's Guide to [employer] Dementia by Caleen Essex MS BSN and Jeneen Rinks  [place]   3) What If It's Not Alzheimer's?: A Caregiver's Guide  to Dementia by Koren Shiver (Author), Octaviano Batty (Editor)  3) The 36 hour day by Rabins and Mace  4) Understanding Difficult Behaviors by Merita Norton and White  Online course for helping caregivers reduce stress, guilt and frustration called the Caregivers Helpbook. The website is www.powerfultoolsforcaregivers.org  As a caregiver you are a Art gallery manager. Problems you face as a caregiver are usually unique to your situation and the way your loved-one's disease manifests itself. The best way to use these books is to look at the Table of Contents and read any chapters of interest or that apply to challenges you are having as a caregiver.  NATIONAL RESOURCES: For more information on neurological disorders or research programs funded by the [employer] of Neurological Disorders and Stroke, contact the Institute's Agricultural consultant (BRAIN) at: [place] [address] [phone] (toll-free) MasterBoxes.it  Information on dementia is also available from the following organizations: Alzheimer's Disease Education and Referral ([place]) [place] [facility] [address] [place] [address] [phone] (toll-free) DVDEnthusiasts.nl  Alzheimer's Association [address] [place] [address] [phone] (toll-free, 24-hour helpline) [phone] (TDD) CapitalMile.co.nz  Alzheimer's Foundation of America [address], [place] [address] [phone] (toll-free) www.alzfdn.org  [facility] [address] [place] [address] [phone] www.alzdiscovery.org  Association for [place] [address], [place] [address] [phone] (toll-free) www[place] [address] [phone]  (toll-free) www.brightfocus.org/alzheimers  [facility] Stabler [facility] [address], [place] [address] [phone] www.https://lambert-jackson.net/  Lewy Body Dementia Association [address], [place] [address] [phone] (toll-free LBD Caregiver Link) www.lbda.[place], [place], [place] [address] [phone] (toll-free) [phone] [facility]) https://carter.com/  National Organization for Rare Disorders [address] [place] [address] 3-267-124-PYKD [phone]) (toll-free) www.rarediseases.org  The Dementias: Hope Through Research was jointly produced by the [employer] of Neurological Disorders and Stroke (NINDS) and the [employer] on Aging (NIA), both part of the [employer], the [employer] research agency-supporting scientific studies that turn discovery into health. NINDS is the nation's leading funder of research on the brain and nervous system. The NINDS mission is to reduce the burden of neurological disease. For more information and resources, visit MasterBoxes.it [1] or call [phone]. NIA leads the federal government effort conducting and supporting research on aging and the health and well-being of older people. NIA's Alzheimer's Disease Education and Referral (ADEAR) Center offers information and publications on dementia and caregiving for families, caregivers, and professionals. For more information, visit DVDEnthusiasts.nl [2] or call ([phone]. Also available from NIA are publications and information about Alzheimer's disease as well as the booklets Frontotemporal Disorders: Information for Patients, Families, and Caregivers and Lewy Body Dementia: Information for Patients, Families, and Professionals. Source URL:  SocialSpecialists.co.nz    Memory Compensation Strategies  1. Use "WARM" strategy.  W= write it down  A= associate it  R= repeat it  M= make a mental note  2.   You can keep a Social worker.  Use a 3-ring notebook with sections for the following: calendar, important names and phone numbers,  medications, doctors' names/phone numbers, lists/reminders, and a section to journal what you did  each day.  3.    Use a calendar to write appointments down.  4.    Write yourself a schedule for the day.  This can be placed on the calendar or in a separate section of the Memory Notebook.  Keeping a  regular schedule can help memory.  5.    Use medication organizer with sections for each day or morning/evening pills.  You may need help loading it  6.    Keep a basket, or pegboard by the door.  Place items that you need to take out with you in the basket or on the pegboard.  You may also want to  include a message board for reminders.  7.    Use sticky notes.  Place sticky notes with reminders in a place where the task is performed.  For example: " turn off the  stove" placed by the stove, "lock the door" placed on the door at eye level, " take your medications" on  the bathroom mirror or by the place where you normally take your medications.  8.    Use alarms/timers.  Use while cooking to remind yourself to check on food or as a reminder to take your medicine, or as a  reminder to make a call, or as a reminder to perform another task, etc.  Mild Neurocognitive Disorder Mild neurocognitive disorder (formerly known as mild cognitive impairment) is a disorder in which memory does not work as well as it should. This disorder may also affect other mental functions, including thought, communication, behavior, and completion of tasks. These impairments are noticeable and measurable, but for the most part they do not interfere with daily activities or the  ability to live independently. Mild neurocognitive disorder typically occurs in people older than 60 years but can occur earlier. It is not as serious as major neurocognitive disorder (formerly known as dementia), but it may be the first sign of it. Generally, symptoms of this condition get worse over time. In rare cases, symptoms can get better. What are the causes? This condition may be caused by:  Brain disorders associated with abnormal protein deposits in the brain, such as: ? Alzheimer disease. ? Frontotemporal dementia. ? Dementia with Lewy bodies.  Brain disorders associated with abnormal movement, such as Parkinson disease or Huntington disease.  Diseases that affect blood vessels in the brain and result in small strokes.  Certain infections, such as HIV.  Traumatic brain injury.  Other medical conditions, such as brain tumors, under-active thyroid (hypothyroidism), and vitamin B12 deficiency.  Use of certain drugs or prescription medicines.  Untreated sleep apnea.  Heart disease, lung disease, liver disease, or kidney disease. What are the signs or symptoms? Symptoms of this condition include:  Difficulty remembering. You may: ? Forget details of recent events, names, or phone numbers. ? Forget social events and appointments. ? Repeatedly forget where you put your car keys or other items.  Difficulty thinking and solving problems. You may have trouble with complex tasks, such as: ? Paying bills. ? Driving in unfamiliar places.  Difficulty communicating. You may have trouble: ? Finding the right word or naming an object. ? Forming a sentence that makes sense, or understanding what you read or hear.  Changes in your behavior or personality. When this happens, you may: ? Lose interest in the things that you used to enjoy. ? Withdraw from social situations. ? Get angry more easily than usual. ? Act before thinking. ? Do things in public that you would not usually  do. How is this diagnosed? This condition is diagnosed based on:  Your symptoms. Your health care provider may ask you as well as people you spend time with, such as family and friends, about your symptoms. Questions may include: ? How often symptoms occur. ? How long they have been occurring. ? Whether they are getting worse. ? The effect they are having on your life.  Evaluation of mental functions (neuropsychological testing). Your health care provider may refer you to a neurologist or mental health specialist for a detailed evaluation of your mental functions. To identify the cause of your mild neurocognitive disorder, your health care provider may:  Get a detailed medical history.  Ask about alcohol and drug use, including prescription medicines.  Perform a physical exam.  Order blood tests and brain imaging exams. How is this treated? If mild neurocognitive disorder is caused by medicine, drug use, infection, or another medical condition, it may improve when the cause is treated, or when medicines or drugs are stopped. Mild neurocognitive disorder resulting from other causes generally does not improve and may worsen. In these cases, the goal of treatment is to help you cope with the loss of mental function. Treatments in these cases include:  Medicine. Medicine helps mainly with memory loss and behavioral symptoms.  Talk therapy. Talk therapy provides education, emotional support, memory aids, and other ways of making up for impairments in mental function.  Lifestyle changes, including: ? Regular exercise. ? A healthy diet that includes omega-3 fatty acids. ? Intellectual stimulation. ? Increased social interaction. Follow these instructions at home: Lifestyle   Exercise regularly as told by your health care provider.  Do not use any products that contain nicotine or tobacco, such as cigarettes and e-cigarettes. If you need help quitting, ask your health care  provider.  Practice stress-management techniques when you get stressed. If you need help managing stress, ask your health care provider.  Stay social.  Keep your mind active with stimulating activities you enjoy, such as reading or playing games.  Make sure to get quality sleep. These tips can help you to get a good night's rest: ? Avoid napping during the day. ? Keep your sleeping area dark and cool. ? Avoid exercising during the few hours before you go to bed. ? Avoid caffeine products in the evening. Eating and drinking  Drink enough fluid to keep your urine clear or pale yellow.  Eat a healthy diet that includes omega-3 fatty acids. These can be found in: ? Fish. ? Nuts. ? Leafy vegetables. ? Vegetable oils. General instructions  Take over-the-counter and prescription medicines only as told by your health care provider. Your health care provider may recommend that you avoid taking medicines that can affect thinking, such as pain or sleeping medicines.  Work with your health care provider to determine what you need help with and what your safety needs are.  Keep all follow-up visits as told by your health care provider. This is important. Contact a health care provider if:  You have any new symptoms. Get help right away if:  You develop new or worsening confusion.  You have behavioral outbursts that place you or your family in danger. Summary  Mild neurocognitive disorder is a disorder in which memory does not work as well as it should. For the most part, this condition does not interfere with a person's daily activities or ability to live independently.  Mild neurocognitive disorder can have many causes and may be the first stage  of Alzheimer disease or other types of dementia.  Exercise, healthy diet, getting quality sleep, and keeping your mind active are very important for brain health. This information is not intended to replace advice given to you by your health  care provider. Make sure you discuss any questions you have with your health care provider. Document Released: 05/18/2013 Document Revised: 11/19/2016 Document Reviewed: 11/19/2016 Elsevier Interactive Patient Education  2019 [employer].

## 2018-11-08 NOTE — Progress Notes (Signed)
[facility]    Provider:  Dr Jaynee Eagles Referring Provider: Ernestene Kiel, MD Primary Care Physician:  Ernestene Kiel, MD  CC:  Memory loss  Interval history 11/08/2018: She returns today for follow up on memory loss. 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 She feel well, memory is stable. She says her time with Dr. Si Raider was not a "stand out event and didn;t pay attention. She does remember her talking about Alzheimer's disease and agrees she is scared she has short-term memory. But she feels stable. She is taking. Discussed plans for the future, her daughter had POA and she is setting up a joint bank acocunt. SHe plans to live with daughter when the time is right. She shared her dignosis with her family and they are aware. She is still driving, she is going to places that she is familiar with during the day.   Patient was formally evaluated by neuropsychology.  Reviewed report in August 2019.  She was diagnosed with amnestic mild cognitive impairment.  She has high estimated baseline intelligence and she performed in the high average to superior range on tests of several cognitive functions including reasoning, visual-spatial construction, attention, processing speed.  She demonstrated impaired delayed recall as well as low average semantic verbal fluency.  Test results indicate amnestic mild cognitive impairment and are concerning for underlying Alzheimer's disease.  She reports possible alcohol use problem however test results are not what would be typically seen in alcohol-related dementia so unlikely etiology but alcohol use could be exacerbating underlying memory impairment.  HPI:  Teryl Gubler is a 78 y.o. female here as a referral from Dr. Laqueta Due for memory problems. Father and mother both had dementia. She is worried about her memory problems,  she may be more hyper alert due to her history.  In the last 2 years noticed memory issues more than usual, short-term memory impaired. She feels more agitated as a correlation with the memory changes. When she is calm it helps her memory. She feels her short term memory is "shot".  She says she feels agitated today and she can't remember anything.  No missing bills. Housekeeping has been going downhill. She forgot her best friends address but did remember daughter's address. No one else is noticing, her kids are around. She has always been "a little eccentric and a little [employer]". She feels her thoughts are poorly organized. Difficulty with expression. She sleeps at 10pm and initiates well but wakes very often, her covers are all over the ned when she wakes up, the sheets are always skewed even the bottom sheet, she is extremely tired during the day but doesn't nap. She says she had depression all her life but feels much better and is working very intensely to resolve any of the tangles in her thinking.   Review of Systems: Patient complains of symptoms per HPI as well as the following symptoms: memory loss. Pertinent negatives and positives per HPI. All others negative.   Social History   Socioeconomic History  . Marital status: Single    Spouse name: Not on file  . Number of children: 3  . Years of education: 9  . Highest education level: Master's degree (e.g., MA, MS, MEng, MEd, MSW, MBA)  Occupational History  . Not on file  Social Needs  . Financial resource strain: Not on file  . Food insecurity:    Worry:  Not on file    Inability: Not on file  . Transportation needs:    Medical: Not on file    Non-medical: Not on file  Tobacco Use  . Smoking status: Current Every Day Smoker    Types: Cigarettes    Last attempt to quit: 09/30/1995    Years since quitting: 23.1  . Smokeless tobacco: Never Used  . Tobacco comment: organic tobacco 2 cigarettes per day  Substance and Sexual Activity   . Alcohol use: No  . Drug use: No  . Sexual activity: Not on file  Lifestyle  . Physical activity:    Days per week: Not on file    Minutes per session: Not on file  . Stress: Not on file  Relationships  . Social connections:    Talks on phone: Not on file    Gets together: Not on file    Attends religious service: Not on file    Active member of club or organization: Not on file    Attends meetings of clubs or organizations: Not on file    Relationship status: Not on file  . Intimate partner violence:    Fear of current or ex partner: Not on file    Emotionally abused: Not on file    Physically abused: Not on file    Forced sexual activity: Not on file  Other Topics Concern  . Not on file  Social History Narrative   Lives at home with pets   Right handed    Family History  Problem Relation Age of Onset  . Alzheimer's disease Mother   . Cancer Mother   . Dementia Father   . Cancer Father   . Alzheimer's disease Maternal Grandfather     Past Medical History:  Diagnosis Date  . Alzheimer's disease with late onset (CODE) ([place]) 11/08/2018  . COPD (chronic obstructive pulmonary disease) ([place])   . Depression   . Insomnia   . Lung cancer, middle lobe ([place])   . Melanoma in situ of upper arm ([place])   . Mild dementia ([place])   . Osteopenia   . Osteoporosis     Past Surgical History:  Procedure Laterality Date  . Right VATS, right middle lobectomy, mediastinal lymph node  05/21/2010  . TUBAL LIGATION      Current Outpatient Medications  Medication Sig Dispense Refill  . donepezil (ARICEPT) 10 MG tablet Take 1 tablet (10 mg total) by mouth at bedtime. 30 tablet 11  . ranitidine (ZANTAC) 300 MG tablet TAKE 1 TABLET BY MOUTH EVERY DAY AT BEDTIME 90 tablet 0  . cyproheptadine (PERIACTIN) 4 MG tablet Take one-2 tablets at bedtime daily (Patient not taking: Reported on 11/08/2018) 60 tablet 5  . MELATONIN PO Take by mouth at bedtime.     No current facility-administered  medications for this visit.     Allergies as of 11/08/2018  . (No Known Allergies)    Vitals: BP (!) 141/81 (BP Location: Right Arm, Patient Position: Sitting)   Pulse 69   Ht 5\' 3"  (1.6 m)   Wt 121 lb (54.9 kg)   BMI 21.43 kg/m  Last Weight:  Wt Readings from Last 1 Encounters:  11/08/18 121 lb (54.9 kg)   Last Height:   Ht Readings from Last 1 Encounters:  11/08/18 5\' 3"  (1.6 m)    Physical exam: Exam: Gen: NAD, tangential and disorganized               CV: RRR, no MRG. No  Carotid Bruits. No peripheral edema, warm, nontender Eyes: Conjunctivae clear without exudates or hemorrhage  Neuro: Detailed Neurologic Exam  Speech:    Speech is normal; fluent and spontaneous with normal comprehension.  Cognition: Montreal Cognitive Assessment  05/07/2018  Visuospatial/ Executive (0/5) 5  Naming (0/3) 3  Attention: Read list of digits (0/2) 1  Attention: Read list of letters (0/1) 1  Attention: Serial 7 subtraction starting at 100 (0/3) 1  Language: Repeat phrase (0/2) 2  Language : Fluency (0/1) 1  Abstraction (0/2) 2  Delayed Recall (0/5) 0  Orientation (0/6) 6  Total 22       The patient is oriented to person, place, and time;     recent and remote memory impaired ;     language fluent;     Impaired attention, concentration, fund of knowledge Cranial Nerves:     The pupils are equal, round, and reactive to light. Attempted fundoscopic exam could not visualize. Visual fields are full to finger confrontation. Extraocular movements are intact. Trigeminal sensation is intact and the muscles of mastication are normal. The face is symmetric. The palate elevates in the midline. Hearing intact. Voice is normal. Shoulder shrug is normal. The tongue has normal motion without fasciculations.   Coordination:    Normal finger to nose and heel to shin. Normal rapid alternating movements.   Gait:    Heel-toe and tandem gait are normal.   Motor Observation:    No asymmetry, no  atrophy, and no involuntary movements noted. Tone:    Normal muscle tone.    Posture:    Posture is normal. normal erect    Strength: left hip flexion 4/5, right hip flexion 5-, otherwise strength is V/V in the upper and lower limbs.      Sensation: intact to LT     Reflex Exam:  DTR's: hypo AJs otherwise very brisk      Toes:    The toes are downgoing bilaterally.   Clonus:    Clonus is absent.      Assessment/Plan:  78 year old female here for memory changes. MoCA 22/30.  She is very tangential and disorganized, but she denies being diagnosed with psychiatric diagnosis or ADHD in the past (but she suspects she may be ADHD). Poor historian and difficult to redirect. She has a history of alcohol abuse 3-4 beers a day for 20 years.   - Diagnosed with mild amnestic cognitive impairment possibly prodromal Alzheimer's.  This was due to formal neurocognitive testing.  It was advised that she limit her alcohol content as this may be also contributing to her memory.  It was also advised that she have family or other trusted individual involved in her care and with future planning with her.  Also individual have some oversight of finances and medications.  Reevaluation in 1 to 2 years.  She was referred to the Alzheimer's [employer] for additional education and resources.  She performed well on a cognitive test highly correlated with driving ability, able to drive locally to familiar well practice locations with minimal traffic during the daytime.  - She says she has informed her kids about her disorder, she appears to be accepting and inciteful and has given her daughter POA.  - Continue Aricept. She declines Namenda and at this time she is so mild that this is acceptable, can consider at a later time in progression.  - Subtle decreased cortical metabolism within the parietal lobes can be associated with early Alzheimer's type pathology.  Consistent with her formal memory testing.  Discussed.  - B1, b6, b12, homocysteine, tsh wnl  - provided lots of reading information and resources online (See AVS)  - see her back in August and will consider reordering formal memory testing.   Cc: Ernestene Kiel, MD   A total of 25 minutes was spent face-to-face with this patient. Over half this time was spent on counseling patient on the  1. Amnestic MCI (mild cognitive impairment with memory loss)     diagnosis and different diagnostic and therapeutic options, counseling and coordination of care, risks ans benefits of management, compliance, or risk factor reduction and education.    Sarina Ill, MD  [facility] [address] [place], [place] [address]  Phone [phone] Fax [phone]

## 2018-11-23 DIAGNOSIS — Z6820 Body mass index (BMI) 20.0-20.9, adult: Secondary | ICD-10-CM | POA: Diagnosis not present

## 2018-11-23 DIAGNOSIS — R1011 Right upper quadrant pain: Secondary | ICD-10-CM | POA: Diagnosis not present

## 2018-11-23 DIAGNOSIS — M25552 Pain in left hip: Secondary | ICD-10-CM | POA: Diagnosis not present

## 2018-11-23 DIAGNOSIS — Z85820 Personal history of malignant melanoma of skin: Secondary | ICD-10-CM | POA: Diagnosis not present

## 2018-11-23 DIAGNOSIS — Z85118 Personal history of other malignant neoplasm of bronchus and lung: Secondary | ICD-10-CM | POA: Diagnosis not present

## 2018-11-23 DIAGNOSIS — M25551 Pain in right hip: Secondary | ICD-10-CM | POA: Diagnosis not present

## 2018-11-25 DIAGNOSIS — C3491 Malignant neoplasm of unspecified part of right bronchus or lung: Secondary | ICD-10-CM | POA: Diagnosis not present

## 2018-11-25 DIAGNOSIS — Z85820 Personal history of malignant melanoma of skin: Secondary | ICD-10-CM | POA: Diagnosis not present

## 2018-11-25 DIAGNOSIS — Z85118 Personal history of other malignant neoplasm of bronchus and lung: Secondary | ICD-10-CM | POA: Diagnosis not present

## 2018-11-25 DIAGNOSIS — M25551 Pain in right hip: Secondary | ICD-10-CM | POA: Diagnosis not present

## 2018-11-25 DIAGNOSIS — M25552 Pain in left hip: Secondary | ICD-10-CM | POA: Diagnosis not present

## 2019-01-10 ENCOUNTER — Ambulatory Visit: Payer: Medicare HMO | Admitting: Allergy and Immunology

## 2019-01-12 ENCOUNTER — Other Ambulatory Visit: Payer: Self-pay

## 2019-01-12 IMAGING — PT NM PET BRAIN AMYLOID
6 series · 21 of 25 positions shown · non-contrast
Comparison: None.

CLINICAL DATA: Memory loss.  76-year-old female

EXAM:
NM PET METABOLIC BRAIN
TECHNIQUE: 9.9 mCi F-18 FDG was injected intravenously via the antecubital
fossa. Full-ring PET imaging was performed from the vertex to the
skull base. CT data was obtained and used for attenuation correction
and anatomic localization. Fasting blood sugar equal 93

[Series 3: pet brain ac · axial · 3.0mm · 2.04mm/px · z∈[-252,-88]mm · 5 of 83 slices shown]
[im 1/83]
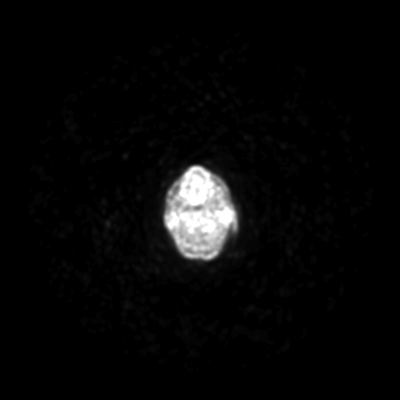
[im 17/83]
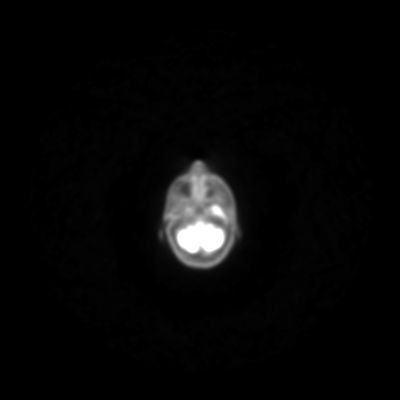
[im 33/83]
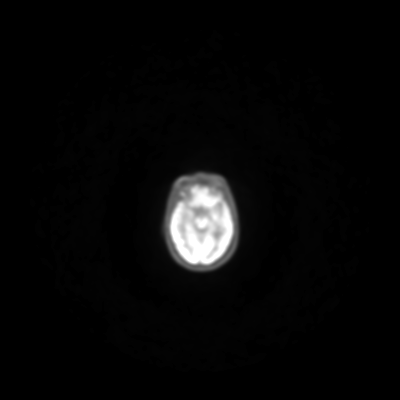
[im 66/83]
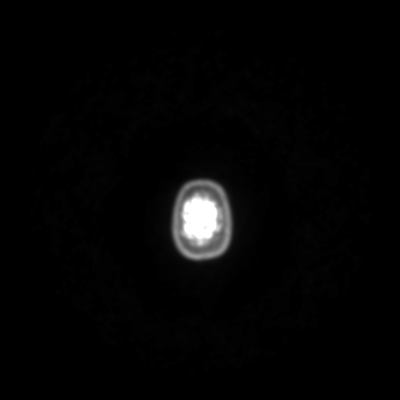
[im 83/83]
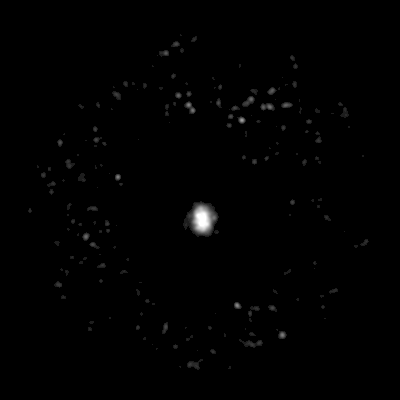

[Series 4: ct brain 3.0 h31s · axial · 3.0mm · 0.40mm/px · z∈[-252,-88]mm · 5 of 83 slices shown]
[im 1/83  soft-tissue]
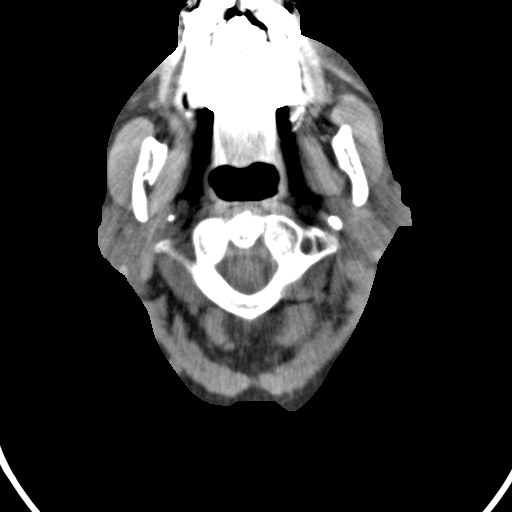
[im 17/83  brain]
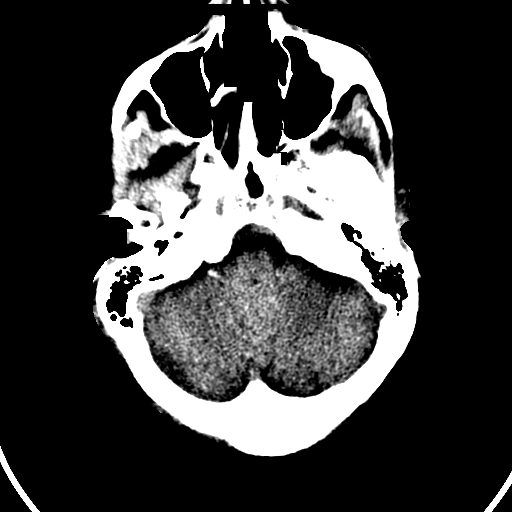
[im 33/83  brain]
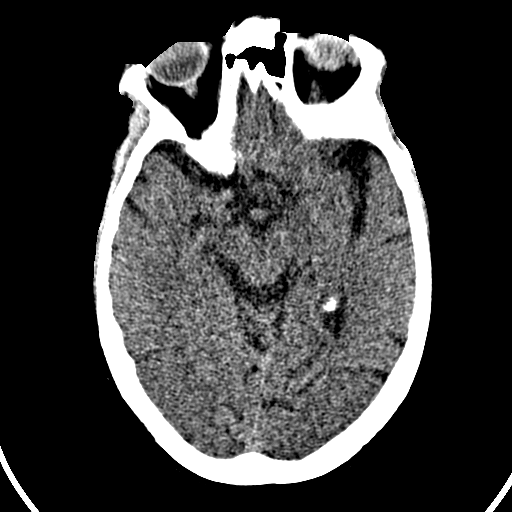
[im 66/83  bone]
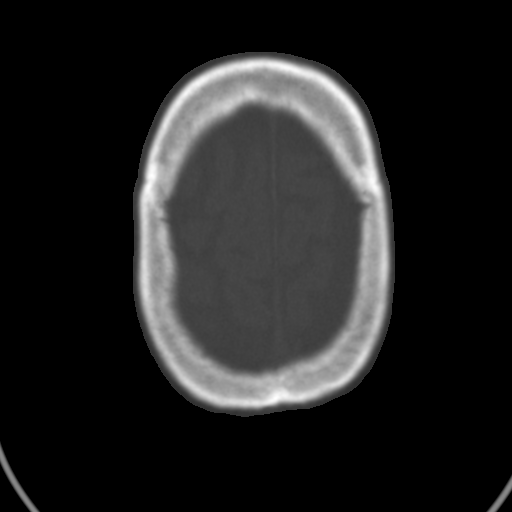
[im 83/83]
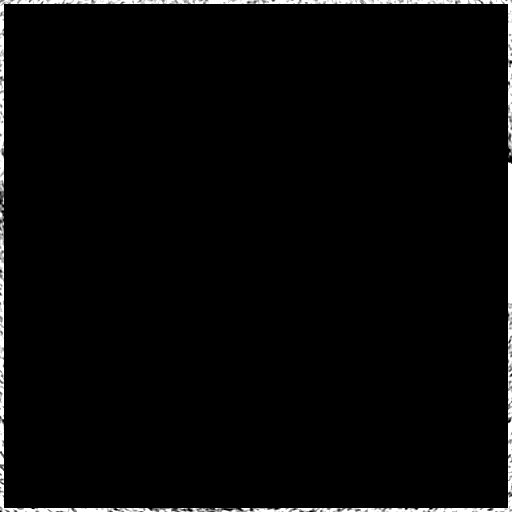

[Series 603: range-ct brain 3.0 (id)<alpha range> · 3 of 39 slices shown (1 of 2)]
[im 1/39]
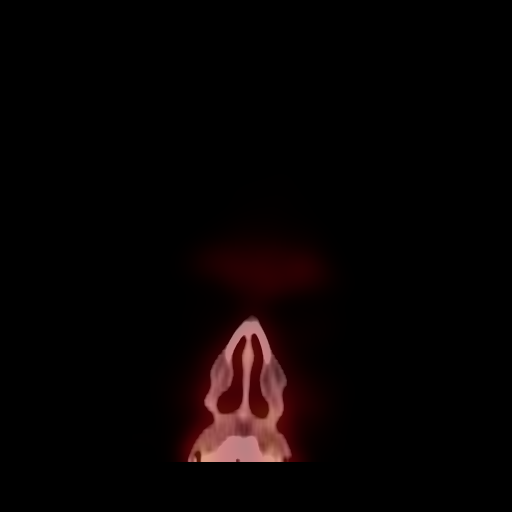
[im 20/39]
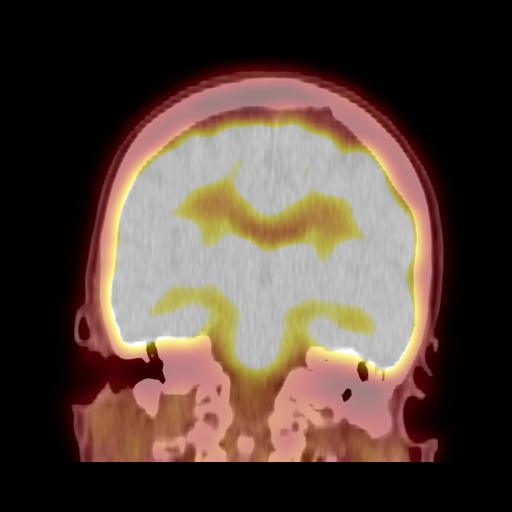
[im 39/39]
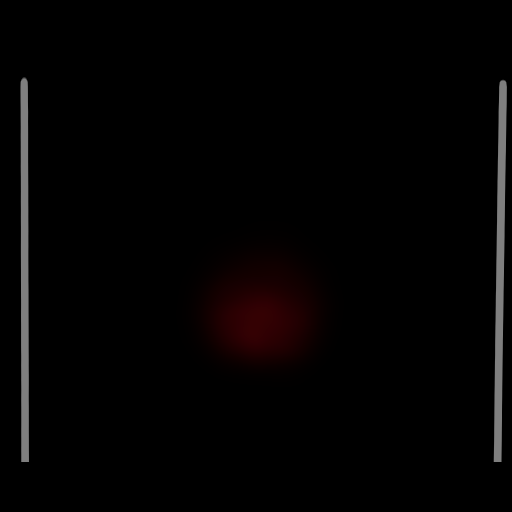

[Series 604: mip range · coronal · 1.68mm/px · 1 of 32 slices shown]
[im 32/32]
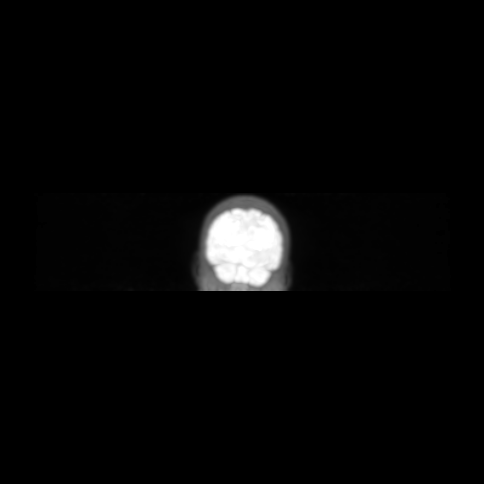

[Series 605: range-ct brain 3.0 (id)<alpha range> · 4 of 53 slices shown (2 of 2)]
[im 1/53]
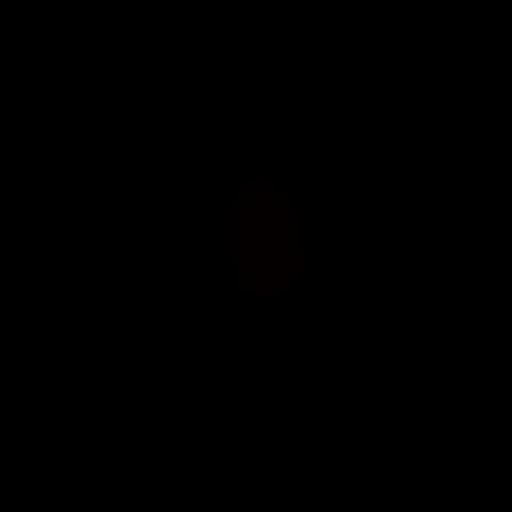
[im 18/53]
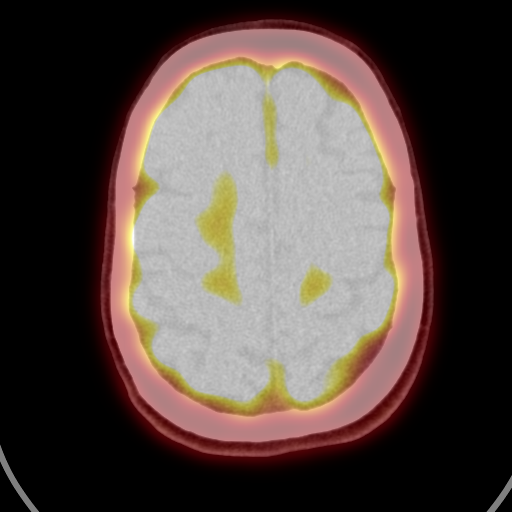
[im 35/53]
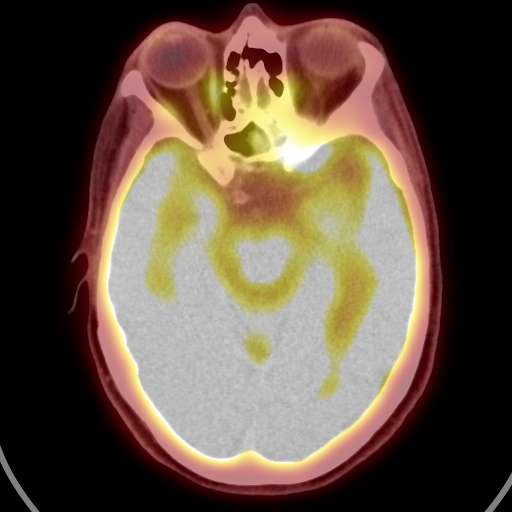
[im 53/53]
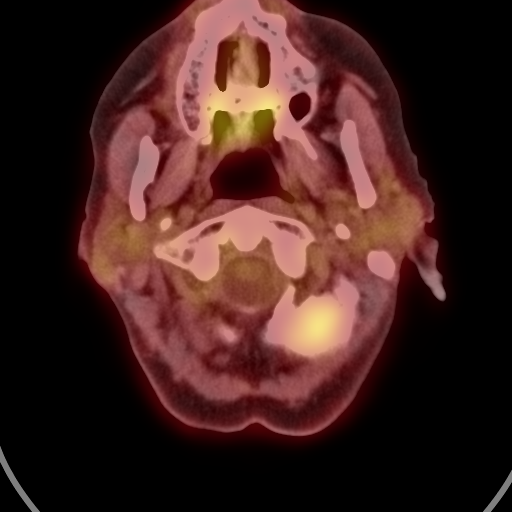

[Series 1047: mpr tra 2mm range · 0.45mm/px · 3 of 60 slices shown]
[im 20/60]
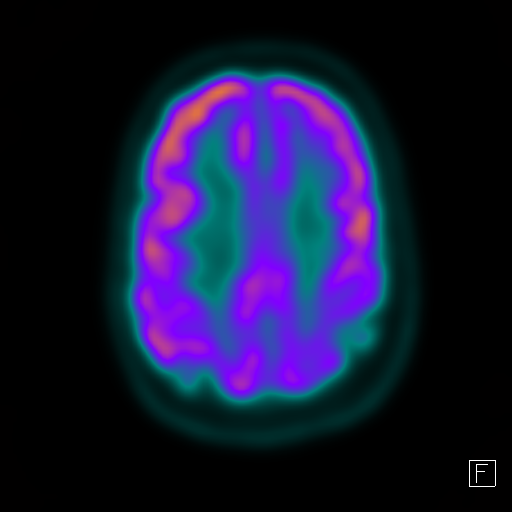
[im 40/60]
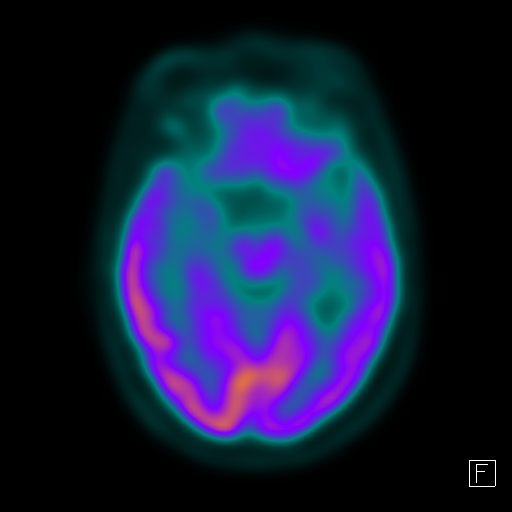
[im 60/60]
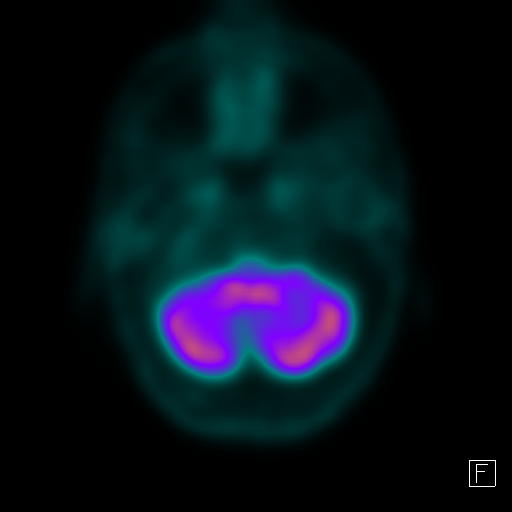

[21 of 25 positions shown; findings below may reference images not displayed]

FINDINGS: There is very subtle relative the decreased cortical metabolism
within the biparietal lobes compared to the frontal lobes. Normal
relative uniform cortical metabolism in the frontal lobes. Normal
cortical metabolism within the occipital lobes.

CT examination demonstrates considerable atrophy in the parietal
lobes.
IMPRESSION: 1. Subtle decreased cortical metabolism within the parietal lobes
can be associated with early Alzheimer's type pathology. Recommend
clinical correlation.
2. Normal relative cortical metabolism the frontal lobes
inconsistent frontotemporal dementia.

## 2019-01-12 MED ORDER — FAMOTIDINE 40 MG PO TABS: 40.0000 mg | ORAL_TABLET | Freq: Every day | ORAL | 5 refills | Status: DC

## 2019-03-21 DIAGNOSIS — H40013 Open angle with borderline findings, low risk, bilateral: Secondary | ICD-10-CM | POA: Diagnosis not present

## 2019-03-21 DIAGNOSIS — H25813 Combined forms of age-related cataract, bilateral: Secondary | ICD-10-CM | POA: Diagnosis not present

## 2019-04-08 DIAGNOSIS — Z01818 Encounter for other preprocedural examination: Secondary | ICD-10-CM | POA: Diagnosis not present

## 2019-04-08 DIAGNOSIS — H25811 Combined forms of age-related cataract, right eye: Secondary | ICD-10-CM | POA: Diagnosis not present

## 2019-04-08 DIAGNOSIS — H2511 Age-related nuclear cataract, right eye: Secondary | ICD-10-CM | POA: Diagnosis not present

## 2019-04-08 HISTORY — PX: CATARACT EXTRACTION: SUR2

## 2019-05-23 ENCOUNTER — Other Ambulatory Visit: Payer: Self-pay

## 2019-05-23 ENCOUNTER — Ambulatory Visit (INDEPENDENT_AMBULATORY_CARE_PROVIDER_SITE_OTHER): Payer: Medicare HMO | Admitting: Adult Health

## 2019-05-23 ENCOUNTER — Encounter: Payer: Self-pay | Admitting: Adult Health

## 2019-05-23 VITALS — BP 126/69 | HR 66 | Temp 97.8°F | Ht 63.0 in | Wt 120.8 lb

## 2019-05-23 DIAGNOSIS — R413 Other amnesia: Secondary | ICD-10-CM | POA: Diagnosis not present

## 2019-05-23 NOTE — Progress Notes (Signed)
PATIENT: Jessica [name] DOB: [birth year]  REASON FOR VISIT: follow up- see HPI HISTORY FROM: patient  HISTORY OF PRESENT ILLNESS: Today 05/23/19 : Jessica [name] is a 78 year old female with a history of memory disturbance.  She returns today for follow-up.  She currently lives alone.  She is able to complete all ADLs independently.  Her daughter is with her today.  She reports that there is been some oversight in her finances.  Her daughter does help her with this.  She is able to prepare her own meals.  Denies any changes with her mood or behavior other than she does not feel as "grounded" as she used to be.  Denies any significant changes with her sleep.  She continues on Aricept.  Daughter reports that her and her sister have noticed some changes with her memory.  She returns today for follow-up  11/08/2018: She returns today for follow up on memory loss. 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 She feel well, memory is stable. She says her time with Dr. Si Raider was not a "stand out event and didn;t pay attention. She does remember her talking about Alzheimer's disease and agrees she is scared she has short-term memory. But she feels stable. She is taking. Discussed plans for the future, her daughter had POA and she is setting up a joint bank acocunt. SHe plans to live with daughter when the time is right. She shared her dignosis with her family and they are aware. She is still driving, she is going to places that she is familiar with during the day.   Patient was formally evaluated by neuropsychology.  Reviewed report in August 2019.  She was diagnosed with amnestic mild cognitive impairment.  She has high estimated baseline intelligence and she performed in the high average to superior range on tests of several cognitive functions including reasoning, visual-spatial construction,  attention, processing speed.  She demonstrated impaired delayed recall as well as low average semantic verbal fluency.  Test results indicate amnestic mild cognitive impairment and are concerning for underlying Alzheimer's disease.  She reports possible alcohol use problem however test results are not what would be typically seen in alcohol-related dementia so unlikely etiology but alcohol use could be exacerbating underlying memory impairment.  HPI:  Jessica [name] is a 78 y.o. female here as a referral from Dr. Laqueta Due for memory problems. Father and mother both had dementia. She is worried about her memory problems, she may be more hyper alert due to her history.  In the last 2 years noticed memory issues more than usual, short-term memory impaired. She feels more agitated as a correlation with the memory changes. When she is calm it helps her memory. She feels her short term memory is "shot".  She says she feels agitated today and she can't remember anything.  No missing bills. Housekeeping has been going downhill. She forgot her best friends address but did remember daughter's address. No one else is noticing, her kids are around. She has always been "a little eccentric and a little [employer]". She feels her thoughts are poorly organized. Difficulty with expression. She sleeps at 10pm and initiates well but wakes very often, her covers are all over the ned when she wakes up, the sheets are always skewed even the bottom sheet, she is extremely tired during the day but doesn't nap. She says she had depression all her  life but feels much better and is working very intensely to resolve any of the tangles in her thinking.     REVIEW OF SYSTEMS: Out of a complete 14 system review of symptoms, the patient complains only of the following symptoms, and all other reviewed systems are negative.  See HPI  ALLERGIES: No Known Allergies  HOME MEDICATIONS: Outpatient Medications Prior to Visit  Medication Sig  Dispense Refill   donepezil (ARICEPT) 10 MG tablet Take 1 tablet (10 mg total) by mouth at bedtime. 30 tablet 11   MELATONIN PO Take by mouth at bedtime.     cyproheptadine (PERIACTIN) 4 MG tablet Take one-2 tablets at bedtime daily (Patient not taking: Reported on 11/08/2018) 60 tablet 5   famotidine (PEPCID) 40 MG tablet Take 1 tablet (40 mg total) by mouth daily. (Patient not taking: Reported on 05/23/2019) 30 tablet 5   ranitidine (ZANTAC) 300 MG tablet TAKE 1 TABLET BY MOUTH EVERY DAY AT BEDTIME (Patient not taking: Reported on 05/23/2019) 90 tablet 0   No facility-administered medications prior to visit.     PAST MEDICAL HISTORY: Past Medical History:  Diagnosis Date   Alzheimer's disease with late onset (CODE) ([place]) 11/08/2018   COPD (chronic obstructive pulmonary disease) ([facility])    Depression    Insomnia    Lung cancer, middle lobe ([facility])    Melanoma in situ of upper arm ([facility])    Mild dementia ([place])    Osteopenia    Osteoporosis     PAST SURGICAL HISTORY: Past Surgical History:  Procedure Laterality Date   CATARACT EXTRACTION Right 04/08/2019   Right VATS, right middle lobectomy, mediastinal lymph node  05/21/2010   TUBAL LIGATION      FAMILY HISTORY: Family History  Problem Relation Age of Onset   Alzheimer's disease Mother    Cancer Mother    Dementia Father    Cancer Father    Alzheimer's disease Maternal Grandfather     SOCIAL HISTORY: Social History   Socioeconomic History   Marital status: Single    Spouse name: Not on file   Number of children: 3   Years of education: 17   Highest education level: Master's degree (e.g., MA, MS, MEng, MEd, MSW, MBA)  Occupational History   Not on file  Social Needs   Financial resource strain: Not on file   Food insecurity    Worry: Not on file    Inability: Not on file   Transportation needs    Medical: Not on file    Non-medical: Not on file  Tobacco Use   Smoking status: Current  Every Day Smoker    Types: Cigarettes    Last attempt to quit: 09/30/1995    Years since quitting: 23.6   Smokeless tobacco: Never Used   Tobacco comment: organic tobacco 2 cigarettes per day  Substance and Sexual Activity   Alcohol use: No   Drug use: No   Sexual activity: Not on file  Lifestyle   Physical activity    Days per week: Not on file    Minutes per session: Not on file   Stress: Not on file  Relationships   Social connections    Talks on phone: Not on file    Gets together: Not on file    Attends religious service: Not on file    Active member of club or organization: Not on file    Attends meetings of clubs or organizations: Not on file    Relationship status: Not  on file   Intimate partner violence    Fear of current or ex partner: Not on file    Emotionally abused: Not on file    Physically abused: Not on file    Forced sexual activity: Not on file  Other Topics Concern   Not on file  Social History Narrative   Lives at home with pets   Right handed      PHYSICAL EXAM  Vitals:   05/23/19 0840  BP: 126/69  Pulse: 66  Temp: 97.8 F (36.6 C)  TempSrc: Oral  Weight: 120 lb 12.8 oz (54.8 kg)  Height: 5\' 3"  (1.6 m)   Body mass index is 21.4 kg/m.  Montreal Cognitive Assessment  05/07/2018  Visuospatial/ Executive (0/5) 5  Naming (0/3) 3  Attention: Read list of digits (0/2) 1  Attention: Read list of letters (0/1) 1  Attention: Serial 7 subtraction starting at 100 (0/3) 1  Language: Repeat phrase (0/2) 2  Language : Fluency (0/1) 1  Abstraction (0/2) 2  Delayed Recall (0/5) 0  Orientation (0/6) 6  Total 22     MMSE - Mini Mental State Exam 05/23/2019  Not completed: (No Data)  Orientation to time 3  Orientation to Place 2  Registration 3  Attention/ Calculation 5  Recall 2  Language- name 2 objects 2  Language- repeat 1  Language- follow 3 step command 3  Language- read & follow direction 1  Write a sentence 1  Copy design 1    Total score 24     Generalized: Well developed, in no acute distress   Neurological examination  Mentation: Alert oriented to time, place, history taking. Follows all commands speech and language fluent. MOCA 25/30 Cranial nerve II-XII:  Extraocular movements were full, visual field were full on confrontational test.Head turning and shoulder shrug  were normal and symmetric. Motor: The motor testing reveals 5 over 5 strength of all 4 extremities. Good symmetric motor tone is noted throughout.  Sensory: Sensory testing is intact to soft touch on all 4 extremities. No evidence of extinction is noted.  Coordination: Cerebellar testing reveals good finger-nose-finger and heel-to-shin bilaterally.  Gait and station: Gait is normal. Tandem gait is normal.  Reflexes: Deep tendon reflexes are symmetric and normal brisk in the lower extremities  DIAGNOSTIC DATA (LABS, IMAGING, TESTING) - I reviewed patient records, labs, notes, testing and imaging myself where available.  Lab Results  Component Value Date   WBC 8.7 05/22/2010   HGB 10.5 (L) 05/22/2010   HCT 32.0 (L) 05/22/2010   MCV 96.4 05/22/2010   PLT 131 (L) 05/22/2010      Component Value Date/Time   NA 141 05/07/2018 1119   K 4.6 05/07/2018 1119   CL 102 05/07/2018 1119   CO2 24 05/07/2018 1119   GLUCOSE 89 05/07/2018 1119   GLUCOSE 107 (H) 05/22/2010 0410   BUN 8 05/07/2018 1119   CREATININE 0.67 05/07/2018 1119   CREATININE 0.65 07/13/2013 1542   CALCIUM 9.9 05/07/2018 1119   PROT 5.0 (L) 05/22/2010 0410   ALBUMIN 2.6 (L) 05/22/2010 0410   AST 16 05/22/2010 0410   ALT 15 05/22/2010 0410   ALKPHOS 44 05/22/2010 0410   BILITOT 0.7 05/22/2010 0410   GFRNONAA 87 05/07/2018 1119   GFRAA 100 05/07/2018 1119   No results found for: CHOL, HDL, LDLCALC, LDLDIRECT, TRIG, CHOLHDL No results found for: HGBA1C Lab Results  Component Value Date   VITAMINB12 625 05/07/2018   Lab Results  Component  Value Date   TSH 1.650  05/07/2018      ASSESSMENT AND PLAN 77 y.o. year old female  has a past medical history of Alzheimer's disease with late onset (CODE) ([place]) (11/08/2018), COPD (chronic obstructive pulmonary disease) ([place]), Depression, Insomnia, Lung cancer, middle lobe ([place]), Melanoma in situ of upper arm ([place]), Mild dementia ([place]), Osteopenia, and Osteoporosis. here with:  1.  Memory disturbance  The patient's memory score has remained stable.  She will continue on Aricept.  We discussed adding on Namenda.  She plans to discuss with her daughters.  I have advised that if her symptoms worsen or she develops new symptoms they should let us know.  She will follow-up in 6 months or sooner if needed.   I spent 15 minutes with the patient. 50% of this time was spent reviewing her memory score   Ward Givens, MSN, NP-C 05/23/2019, 9:05 AM [facility] [address], [place], [place] [address] [phone]

## 2019-05-23 NOTE — Patient Instructions (Addendum)
Your Plan:  Continue Aricept Consider Namenda If your symptoms worsen or you develop new symptoms please let us know.   Thank you for coming to see [place] at [facility]. I hope we have been able to provide you high quality care today.  You may receive a patient satisfaction survey over the next few weeks. We would appreciate your feedback and comments so that we may continue to improve ourselves and the health of our patients.   Memantine Tablets What is this medicine? MEMANTINE (MEM an teen) is used to treat dementia caused by Alzheimer's disease. This medicine may be used for other purposes; ask your health care provider or pharmacist if you have questions. COMMON BRAND NAME(S): Namenda What should I tell my health care provider before I take this medicine? They need to know if you have any of these conditions:  difficulty passing urine  kidney disease  liver disease  seizures  an unusual or allergic reaction to memantine, other medicines, foods, dyes, or preservatives  pregnant or trying to get pregnant  breast-feeding How should I use this medicine? Take this medicine by mouth with a glass of water. Follow the directions on the prescription label. You may take this medicine with or without food. Take your doses at regular intervals. Do not take your medicine more often than directed. Continue to take your medicine even if you feel better. Do not stop taking except on the advice of your doctor or health care professional. Talk to your pediatrician regarding the use of this medicine in children. Special care may be needed. Overdosage: If you think you have taken too much of this medicine contact a poison control center or emergency room at once. NOTE: This medicine is only for you. Do not share this medicine with others. What if I miss a dose? If you miss a dose, take it as soon as you can. If it is almost time for your next dose, take only that dose. Do not  take double or extra doses. If you do not take your medicine for several days, contact your health care provider. Your dose may need to be changed. What may interact with this medicine?  acetazolamide  amantadine  cimetidine  dextromethorphan  dofetilide  hydrochlorothiazide  ketamine  metformin  methazolamide  quinidine  ranitidine  sodium bicarbonate  triamterene This list may not describe all possible interactions. Give your health care provider a list of all the medicines, herbs, non-prescription drugs, or dietary supplements you use. Also tell them if you smoke, drink alcohol, or use illegal drugs. Some items may interact with your medicine. What should I watch for while using this medicine? Visit your doctor or health care professional for regular checks on your progress. Check with your doctor or health care professional if there is no improvement in your symptoms or if they get worse. You may get drowsy or dizzy. Do not drive, use machinery, or do anything that needs mental alertness until you know how this drug affects you. Do not stand or sit up quickly, especially if you are an older patient. This reduces the risk of dizzy or fainting spells. Alcohol can make you more drowsy and dizzy. Avoid alcoholic drinks. What side effects may I notice from receiving this medicine? Side effects that you should report to your doctor or health care professional as soon as possible:  allergic reactions like skin rash, itching or hives, swelling of the face, lips, or tongue  agitation or a feeling of  restlessness  depressed mood  dizziness  hallucinations  redness, blistering, peeling or loosening of the skin, including inside the mouth  seizures  vomiting Side effects that usually do not require medical attention (report to your doctor or health care professional if they continue or are bothersome):  constipation  diarrhea  headache  nausea  trouble  sleeping This list may not describe all possible side effects. Call your doctor for medical advice about side effects. You may report side effects to FDA at 1-800-FDA-1088. Where should I keep my medicine? Keep out of the reach of children. Store at room temperature between 15 degrees and 30 degrees C (59 degrees and 86 degrees F). Throw away any unused medicine after the expiration date. NOTE: This sheet is a summary. It may not cover all possible information. If you have questions about this medicine, talk to your doctor, pharmacist, or health care provider.  2020 Elsevier/Gold Standard (2013-07-04 14:10:42)

## 2019-05-26 DIAGNOSIS — Z23 Encounter for immunization: Secondary | ICD-10-CM | POA: Diagnosis not present

## 2019-05-26 DIAGNOSIS — Z0000 Encounter for general adult medical examination without abnormal findings: Secondary | ICD-10-CM | POA: Diagnosis not present

## 2019-05-26 DIAGNOSIS — F5101 Primary insomnia: Secondary | ICD-10-CM | POA: Diagnosis not present

## 2019-05-26 DIAGNOSIS — Z1339 Encounter for screening examination for other mental health and behavioral disorders: Secondary | ICD-10-CM | POA: Diagnosis not present

## 2019-05-26 DIAGNOSIS — Z6821 Body mass index (BMI) 21.0-21.9, adult: Secondary | ICD-10-CM | POA: Diagnosis not present

## 2019-05-26 DIAGNOSIS — F0390 Unspecified dementia without behavioral disturbance: Secondary | ICD-10-CM | POA: Diagnosis not present

## 2019-05-26 DIAGNOSIS — Z1331 Encounter for screening for depression: Secondary | ICD-10-CM | POA: Diagnosis not present

## 2019-05-26 DIAGNOSIS — Z1231 Encounter for screening mammogram for malignant neoplasm of breast: Secondary | ICD-10-CM | POA: Diagnosis not present

## 2019-05-26 DIAGNOSIS — S61211A Laceration without foreign body of left index finger without damage to nail, initial encounter: Secondary | ICD-10-CM | POA: Diagnosis not present

## 2019-06-03 DIAGNOSIS — H2512 Age-related nuclear cataract, left eye: Secondary | ICD-10-CM | POA: Diagnosis not present

## 2019-06-03 DIAGNOSIS — H25812 Combined forms of age-related cataract, left eye: Secondary | ICD-10-CM | POA: Diagnosis not present

## 2019-06-14 DIAGNOSIS — H25812 Combined forms of age-related cataract, left eye: Secondary | ICD-10-CM | POA: Diagnosis not present

## 2019-06-28 DIAGNOSIS — Z961 Presence of intraocular lens: Secondary | ICD-10-CM | POA: Diagnosis not present

## 2019-06-30 DIAGNOSIS — Z0100 Encounter for examination of eyes and vision without abnormal findings: Secondary | ICD-10-CM | POA: Diagnosis not present

## 2019-07-01 DIAGNOSIS — M542 Cervicalgia: Secondary | ICD-10-CM | POA: Diagnosis not present

## 2019-07-01 DIAGNOSIS — J449 Chronic obstructive pulmonary disease, unspecified: Secondary | ICD-10-CM | POA: Diagnosis not present

## 2019-07-01 DIAGNOSIS — Z6822 Body mass index (BMI) 22.0-22.9, adult: Secondary | ICD-10-CM | POA: Diagnosis not present

## 2019-10-28 DIAGNOSIS — F0390 Unspecified dementia without behavioral disturbance: Secondary | ICD-10-CM | POA: Diagnosis not present

## 2019-10-28 DIAGNOSIS — Z1331 Encounter for screening for depression: Secondary | ICD-10-CM | POA: Diagnosis not present

## 2019-10-28 DIAGNOSIS — H6123 Impacted cerumen, bilateral: Secondary | ICD-10-CM | POA: Diagnosis not present

## 2019-10-28 DIAGNOSIS — R1011 Right upper quadrant pain: Secondary | ICD-10-CM | POA: Diagnosis not present

## 2019-10-28 DIAGNOSIS — R519 Headache, unspecified: Secondary | ICD-10-CM | POA: Diagnosis not present

## 2019-10-28 DIAGNOSIS — Z6823 Body mass index (BMI) 23.0-23.9, adult: Secondary | ICD-10-CM | POA: Diagnosis not present

## 2019-10-28 DIAGNOSIS — G4700 Insomnia, unspecified: Secondary | ICD-10-CM | POA: Diagnosis not present

## 2019-11-23 ENCOUNTER — Ambulatory Visit: Payer: Medicare HMO | Admitting: Adult Health

## 2019-11-23 ENCOUNTER — Other Ambulatory Visit: Payer: Self-pay

## 2019-11-23 ENCOUNTER — Encounter: Payer: Self-pay | Admitting: Adult Health

## 2019-11-23 VITALS — BP 116/72 | HR 73 | Temp 97.1°F | Ht 63.0 in | Wt 129.8 lb

## 2019-11-23 DIAGNOSIS — G479 Sleep disorder, unspecified: Secondary | ICD-10-CM

## 2019-11-23 DIAGNOSIS — R413 Other amnesia: Secondary | ICD-10-CM | POA: Diagnosis not present

## 2019-11-23 MED ORDER — DONEPEZIL HCL 10 MG PO TABS: 10.0000 mg | ORAL_TABLET | Freq: Every day | ORAL | 3 refills | Status: DC

## 2019-11-23 NOTE — Patient Instructions (Signed)
Your Plan:  Continue to monitor memory Memory score is stable 27/30 Continue Aricept If your symptoms worsen or you develop new symptoms please let us know.   Thank you for coming to see [place] at [facility]. I hope we have been able to provide you high quality care today.  You may receive a patient satisfaction survey over the next few weeks. We would appreciate your feedback and comments so that we may continue to improve ourselves and the health of our patients.

## 2019-11-23 NOTE — Progress Notes (Addendum)
PATIENT: Jessica [name] DOB: 05-04-1941  REASON FOR VISIT: follow up HISTORY FROM: patient  HISTORY OF PRESENT ILLNESS: Today 11/23/19:  Jessica [name] is a 79 year old female with a history of memory disturbance.  She returns today for follow-up.  She continues on Aricept 10 mg daily.  She lives at home alone.  Able to complete all ADLs independently.  She manages her own medications and appointments.  Denies any trouble driving.  She is able to complete household chores.  She states that she has trouble sleeping.  This has been ongoing for several years.  Her PCP recently placed her on phenobarbital she reports that it helped for approximately 2 weeks.  She returns today for an evaluation.    HISTORY 05/23/19 : Jessica [name] is a 79 year old female with a history of memory disturbance.  She returns today for follow-up.  She currently lives alone.  She is able to complete all ADLs independently.  Her daughter is with her today.  She reports that there is been some oversight in her finances.  Her daughter does help her with this.  She is able to prepare her own meals.  Denies any changes with her mood or behavior other than she does not feel as "grounded" as she used to be.  Denies any significant changes with her sleep.  She continues on Aricept.  Daughter reports that her and her sister have noticed some changes with her memory.  She returns today for follow-up  REVIEW OF SYSTEMS: Out of a complete 14 system review of symptoms, the patient complains only of the following symptoms, and all other reviewed systems are negative.  See HPI  ALLERGIES: No Known Allergies  HOME MEDICATIONS: Outpatient Medications Prior to Visit  Medication Sig Dispense Refill  . donepezil (ARICEPT) 10 MG tablet Take 1 tablet (10 mg total) by mouth at bedtime. 30 tablet 11  . MELATONIN PO Take by mouth at bedtime.    Marland Kitchen PHENObarbital (LUMINAL) 60 MG tablet Take 60 mg by mouth at bedtime. Take 1/2 to 1 tablet    .  cyproheptadine (PERIACTIN) 4 MG tablet Take one-2 tablets at bedtime daily (Patient not taking: Reported on 11/08/2018) 60 tablet 5  . famotidine (PEPCID) 40 MG tablet Take 1 tablet (40 mg total) by mouth daily. (Patient not taking: Reported on 05/23/2019) 30 tablet 5  . ranitidine (ZANTAC) 300 MG tablet TAKE 1 TABLET BY MOUTH EVERY DAY AT BEDTIME (Patient not taking: Reported on 05/23/2019) 90 tablet 0   No facility-administered medications prior to visit.    PAST MEDICAL HISTORY: Past Medical History:  Diagnosis Date  . Alzheimer's disease with late onset (CODE) ([place]) 11/08/2018  . COPD (chronic obstructive pulmonary disease) ([place])   . Depression   . Insomnia   . Lung cancer, middle lobe ([place])   . Melanoma in situ of upper arm ([place])   . Mild dementia ([place])   . Osteopenia   . Osteoporosis     PAST SURGICAL HISTORY: Past Surgical History:  Procedure Laterality Date  . CATARACT EXTRACTION Right 04/08/2019  . Right VATS, right middle lobectomy, mediastinal lymph node  05/21/2010  . TUBAL LIGATION      FAMILY HISTORY: Family History  Problem Relation Age of Onset  . Alzheimer's disease Mother   . Cancer Mother   . Dementia Father   . Cancer Father   . Alzheimer's disease Maternal Grandfather     SOCIAL HISTORY: Social History   Socioeconomic History  . Marital status:  Single    Spouse name: Not on file  . Number of children: 3  . Years of education: 64  . Highest education level: Master's degree (e.g., MA, MS, MEng, MEd, MSW, MBA)  Occupational History  . Not on file  Tobacco Use  . Smoking status: Current Every Day Smoker    Types: Cigarettes    Last attempt to quit: 09/30/1995    Years since quitting: 24.1  . Smokeless tobacco: Never Used  . Tobacco comment: organic tobacco 2 cigarettes per day  Substance and Sexual Activity  . Alcohol use: No  . Drug use: No  . Sexual activity: Not on file  Other Topics Concern  . Not on file  Social History Narrative   Lives  at home with pets   Right handed   Social Determinants of Health   Financial Resource Strain:   . Difficulty of Paying Living Expenses: Not on file  Food Insecurity:   . Worried About Charity fundraiser in the Last Year: Not on file  . Ran Out of Food in the Last Year: Not on file  Transportation Needs:   . Lack of Transportation (Medical): Not on file  . Lack of Transportation (Non-Medical): Not on file  Physical Activity:   . Days of Exercise per Week: Not on file  . Minutes of Exercise per Session: Not on file  Stress:   . Feeling of Stress : Not on file  Social Connections:   . Frequency of Communication with Friends and Family: Not on file  . Frequency of Social Gatherings with Friends and Family: Not on file  . Attends Religious Services: Not on file  . Active Member of Clubs or Organizations: Not on file  . Attends Archivist Meetings: Not on file  . Marital Status: Not on file  Intimate Partner Violence:   . Fear of Current or Ex-Partner: Not on file  . Emotionally Abused: Not on file  . Physically Abused: Not on file  . Sexually Abused: Not on file      PHYSICAL EXAM  Vitals:   11/23/19 0859  BP: 116/72  Pulse: 73  Temp: (!) 97.1 F (36.2 C)  TempSrc: Oral  Weight: 129 lb 12.8 oz (58.9 kg)  Height: 5\' 3"  (1.6 m)   Body mass index is 22.99 kg/m.   MMSE - Mini Mental State Exam 11/23/2019 05/23/2019  Not completed: (No Data) (No Data)  Orientation to time 4 3  Orientation to Place 4 2  Registration 3 3  Attention/ Calculation 5 5  Recall 2 2  Language- name 2 objects 2 2  Language- repeat 1 1  Language- follow 3 step command 3 3  Language- read & follow direction 1 1  Write a sentence 1 1  Copy design 1 1  Total score 27 24     Generalized: Well developed, in no acute distress   Neurological examination  Mentation: Alert oriented to time, place, history taking. Follows all commands speech and language fluent Cranial nerve II-XII:  Pupils were equal round reactive to light. Extraocular movements were full, visual field were full on confrontational test.  Head turning and shoulder shrug  were normal and symmetric. Motor: The motor testing reveals 5 over 5 strength of all 4 extremities. Good symmetric motor tone is noted throughout.  Sensory: Sensory testing is intact to soft touch on all 4 extremities. No evidence of extinction is noted.  Coordination: Cerebellar testing reveals good finger-nose-finger and heel-to-shin bilaterally.  Gait and station: Gait is normal. Romberg is negative. No drift is seen.  Reflexes: Deep tendon reflexes are symmetric and normal bilaterally.   DIAGNOSTIC DATA (LABS, IMAGING, TESTING) - I reviewed patient records, labs, notes, testing and imaging myself where available.  Lab Results  Component Value Date   WBC 8.7 05/22/2010   HGB 10.5 (L) 05/22/2010   HCT 32.0 (L) 05/22/2010   MCV 96.4 05/22/2010   PLT 131 (L) 05/22/2010      Component Value Date/Time   NA 141 05/07/2018 1119   K 4.6 05/07/2018 1119   CL 102 05/07/2018 1119   CO2 24 05/07/2018 1119   GLUCOSE 89 05/07/2018 1119   GLUCOSE 107 (H) 05/22/2010 0410   BUN 8 05/07/2018 1119   CREATININE 0.67 05/07/2018 1119   CREATININE 0.65 07/13/2013 1542   CALCIUM 9.9 05/07/2018 1119   PROT 5.0 (L) 05/22/2010 0410   ALBUMIN 2.6 (L) 05/22/2010 0410   AST 16 05/22/2010 0410   ALT 15 05/22/2010 0410   ALKPHOS 44 05/22/2010 0410   BILITOT 0.7 05/22/2010 0410   GFRNONAA 87 05/07/2018 1119   GFRAA 100 05/07/2018 1119   No results found for: CHOL, HDL, LDLCALC, LDLDIRECT, TRIG, CHOLHDL No results found for: HGBA1C Lab Results  Component Value Date   VITAMINB12 625 05/07/2018   Lab Results  Component Value Date   TSH 1.650 05/07/2018      ASSESSMENT AND PLAN 79 y.o. year old female  has a past medical history of Alzheimer's disease with late onset (CODE) ([place]) (11/08/2018), COPD (chronic obstructive pulmonary disease)  ([place]), Depression, Insomnia, Lung cancer, middle lobe ([place]), Melanoma in situ of upper arm ([place]), Mild dementia ([place]), Osteopenia, and Osteoporosis. here with:  1.  Memory disturbance   -MMSE 27/30-score has improved slightly -Continue Aricept 10 mg at bedtime -Continue to monitor symptoms  2.  Sleep disturbance  -Discussed good sleep hygiene -Advised to discuss with PCP about a sleep referral  Advised if symptoms worsen or she develops new symptoms she should let us know.  Follow-up in 6 months or sooner if needed  I spent 25 minutes with the patient this time was spent discussing her memory score, medications and sleep issues   Ward Givens, MSN, NP-C 11/23/2019, 9:25 AM [facility] [address] [place], [place] [address] [phone]  Made any corrections needed, and agree with history, physical, neuro exam,assessment and plan as stated.     Sarina Ill, MD [facility]

## 2019-12-16 DIAGNOSIS — Z6823 Body mass index (BMI) 23.0-23.9, adult: Secondary | ICD-10-CM | POA: Diagnosis not present

## 2019-12-16 DIAGNOSIS — F5101 Primary insomnia: Secondary | ICD-10-CM | POA: Diagnosis not present

## 2019-12-16 DIAGNOSIS — J449 Chronic obstructive pulmonary disease, unspecified: Secondary | ICD-10-CM | POA: Diagnosis not present

## 2019-12-16 DIAGNOSIS — M7060 Trochanteric bursitis, unspecified hip: Secondary | ICD-10-CM | POA: Diagnosis not present

## 2020-01-25 DIAGNOSIS — S92354A Nondisplaced fracture of fifth metatarsal bone, right foot, initial encounter for closed fracture: Secondary | ICD-10-CM | POA: Diagnosis not present

## 2020-01-25 DIAGNOSIS — M79671 Pain in right foot: Secondary | ICD-10-CM | POA: Diagnosis not present

## 2020-01-25 DIAGNOSIS — S9001XA Contusion of right ankle, initial encounter: Secondary | ICD-10-CM | POA: Diagnosis not present

## 2020-01-27 DIAGNOSIS — S92351A Displaced fracture of fifth metatarsal bone, right foot, initial encounter for closed fracture: Secondary | ICD-10-CM | POA: Diagnosis not present

## 2020-02-28 DIAGNOSIS — S92351A Displaced fracture of fifth metatarsal bone, right foot, initial encounter for closed fracture: Secondary | ICD-10-CM | POA: Diagnosis not present

## 2020-02-28 DIAGNOSIS — S99921A Unspecified injury of right foot, initial encounter: Secondary | ICD-10-CM | POA: Diagnosis not present

## 2020-03-20 DIAGNOSIS — R1084 Generalized abdominal pain: Secondary | ICD-10-CM | POA: Diagnosis not present

## 2020-03-20 DIAGNOSIS — G4700 Insomnia, unspecified: Secondary | ICD-10-CM | POA: Diagnosis not present

## 2020-03-20 DIAGNOSIS — D692 Other nonthrombocytopenic purpura: Secondary | ICD-10-CM | POA: Diagnosis not present

## 2020-03-20 DIAGNOSIS — F0390 Unspecified dementia without behavioral disturbance: Secondary | ICD-10-CM | POA: Diagnosis not present

## 2020-04-20 DIAGNOSIS — S9001XA Contusion of right ankle, initial encounter: Secondary | ICD-10-CM | POA: Diagnosis not present

## 2020-04-20 DIAGNOSIS — S8011XA Contusion of right lower leg, initial encounter: Secondary | ICD-10-CM | POA: Diagnosis not present

## 2020-04-20 DIAGNOSIS — N3091 Cystitis, unspecified with hematuria: Secondary | ICD-10-CM | POA: Diagnosis not present

## 2020-04-24 DIAGNOSIS — Z79899 Other long term (current) drug therapy: Secondary | ICD-10-CM | POA: Diagnosis not present

## 2020-04-24 DIAGNOSIS — G3184 Mild cognitive impairment, so stated: Secondary | ICD-10-CM | POA: Diagnosis not present

## 2020-04-24 DIAGNOSIS — R5381 Other malaise: Secondary | ICD-10-CM | POA: Diagnosis not present

## 2020-04-24 DIAGNOSIS — G4709 Other insomnia: Secondary | ICD-10-CM | POA: Diagnosis not present

## 2020-04-24 DIAGNOSIS — E785 Hyperlipidemia, unspecified: Secondary | ICD-10-CM | POA: Diagnosis not present

## 2020-04-24 DIAGNOSIS — Z7689 Persons encountering health services in other specified circumstances: Secondary | ICD-10-CM | POA: Diagnosis not present

## 2020-04-24 DIAGNOSIS — Z0000 Encounter for general adult medical examination without abnormal findings: Secondary | ICD-10-CM | POA: Diagnosis not present

## 2020-04-24 DIAGNOSIS — R5383 Other fatigue: Secondary | ICD-10-CM | POA: Diagnosis not present

## 2020-04-24 DIAGNOSIS — Z85118 Personal history of other malignant neoplasm of bronchus and lung: Secondary | ICD-10-CM | POA: Diagnosis not present

## 2020-05-08 DIAGNOSIS — N3001 Acute cystitis with hematuria: Secondary | ICD-10-CM | POA: Diagnosis not present

## 2020-05-08 DIAGNOSIS — R1084 Generalized abdominal pain: Secondary | ICD-10-CM | POA: Diagnosis not present

## 2020-05-08 DIAGNOSIS — N3091 Cystitis, unspecified with hematuria: Secondary | ICD-10-CM | POA: Diagnosis not present

## 2020-05-11 DIAGNOSIS — R0602 Shortness of breath: Secondary | ICD-10-CM | POA: Diagnosis not present

## 2020-05-11 DIAGNOSIS — R05 Cough: Secondary | ICD-10-CM | POA: Diagnosis not present

## 2020-05-11 DIAGNOSIS — J189 Pneumonia, unspecified organism: Secondary | ICD-10-CM | POA: Diagnosis not present

## 2020-05-11 DIAGNOSIS — Z20828 Contact with and (suspected) exposure to other viral communicable diseases: Secondary | ICD-10-CM | POA: Diagnosis not present

## 2020-05-14 DIAGNOSIS — R410 Disorientation, unspecified: Secondary | ICD-10-CM | POA: Diagnosis not present

## 2020-05-14 DIAGNOSIS — R0902 Hypoxemia: Secondary | ICD-10-CM | POA: Diagnosis not present

## 2020-05-14 DIAGNOSIS — R590 Localized enlarged lymph nodes: Secondary | ICD-10-CM | POA: Diagnosis not present

## 2020-05-14 DIAGNOSIS — J189 Pneumonia, unspecified organism: Secondary | ICD-10-CM | POA: Diagnosis not present

## 2020-05-14 DIAGNOSIS — J910 Malignant pleural effusion: Secondary | ICD-10-CM | POA: Diagnosis not present

## 2020-05-14 DIAGNOSIS — Z9981 Dependence on supplemental oxygen: Secondary | ICD-10-CM | POA: Diagnosis not present

## 2020-05-14 DIAGNOSIS — S99912A Unspecified injury of left ankle, initial encounter: Secondary | ICD-10-CM | POA: Diagnosis not present

## 2020-05-14 DIAGNOSIS — R531 Weakness: Secondary | ICD-10-CM | POA: Diagnosis not present

## 2020-05-14 DIAGNOSIS — J449 Chronic obstructive pulmonary disease, unspecified: Secondary | ICD-10-CM | POA: Diagnosis not present

## 2020-05-14 DIAGNOSIS — I7090 Unspecified atherosclerosis: Secondary | ICD-10-CM | POA: Diagnosis not present

## 2020-05-14 DIAGNOSIS — R270 Ataxia, unspecified: Secondary | ICD-10-CM | POA: Diagnosis not present

## 2020-05-14 DIAGNOSIS — J90 Pleural effusion, not elsewhere classified: Secondary | ICD-10-CM | POA: Diagnosis not present

## 2020-05-14 DIAGNOSIS — I361 Nonrheumatic tricuspid (valve) insufficiency: Secondary | ICD-10-CM | POA: Diagnosis not present

## 2020-05-14 DIAGNOSIS — G309 Alzheimer's disease, unspecified: Secondary | ICD-10-CM | POA: Diagnosis not present

## 2020-05-14 DIAGNOSIS — Z85118 Personal history of other malignant neoplasm of bronchus and lung: Secondary | ICD-10-CM | POA: Diagnosis not present

## 2020-05-14 DIAGNOSIS — W19XXXA Unspecified fall, initial encounter: Secondary | ICD-10-CM | POA: Diagnosis not present

## 2020-05-14 DIAGNOSIS — R296 Repeated falls: Secondary | ICD-10-CM | POA: Diagnosis not present

## 2020-05-14 DIAGNOSIS — I700 Atherosclerosis of aorta: Secondary | ICD-10-CM | POA: Diagnosis not present

## 2020-05-14 DIAGNOSIS — C3431 Malignant neoplasm of lower lobe, right bronchus or lung: Secondary | ICD-10-CM | POA: Diagnosis not present

## 2020-05-14 DIAGNOSIS — I639 Cerebral infarction, unspecified: Secondary | ICD-10-CM | POA: Diagnosis not present

## 2020-05-14 DIAGNOSIS — R200 Anesthesia of skin: Secondary | ICD-10-CM | POA: Diagnosis not present

## 2020-05-14 DIAGNOSIS — G319 Degenerative disease of nervous system, unspecified: Secondary | ICD-10-CM | POA: Diagnosis not present

## 2020-05-14 DIAGNOSIS — I6340 Cerebral infarction due to embolism of unspecified cerebral artery: Secondary | ICD-10-CM | POA: Diagnosis not present

## 2020-05-14 DIAGNOSIS — J181 Lobar pneumonia, unspecified organism: Secondary | ICD-10-CM | POA: Diagnosis not present

## 2020-05-14 DIAGNOSIS — I6389 Other cerebral infarction: Secondary | ICD-10-CM | POA: Diagnosis not present

## 2020-05-14 DIAGNOSIS — C384 Malignant neoplasm of pleura: Secondary | ICD-10-CM | POA: Diagnosis not present

## 2020-05-14 DIAGNOSIS — R918 Other nonspecific abnormal finding of lung field: Secondary | ICD-10-CM | POA: Diagnosis not present

## 2020-05-14 DIAGNOSIS — F0280 Dementia in other diseases classified elsewhere without behavioral disturbance: Secondary | ICD-10-CM | POA: Diagnosis not present

## 2020-05-14 DIAGNOSIS — J440 Chronic obstructive pulmonary disease with acute lower respiratory infection: Secondary | ICD-10-CM | POA: Diagnosis not present

## 2020-05-14 DIAGNOSIS — J439 Emphysema, unspecified: Secondary | ICD-10-CM | POA: Diagnosis not present

## 2020-05-14 DIAGNOSIS — C342 Malignant neoplasm of middle lobe, bronchus or lung: Secondary | ICD-10-CM | POA: Diagnosis not present

## 2020-05-14 DIAGNOSIS — J1282 Pneumonia due to coronavirus disease 2019: Secondary | ICD-10-CM | POA: Diagnosis not present

## 2020-05-14 DIAGNOSIS — J8489 Other specified interstitial pulmonary diseases: Secondary | ICD-10-CM | POA: Diagnosis not present

## 2020-05-14 DIAGNOSIS — R911 Solitary pulmonary nodule: Secondary | ICD-10-CM | POA: Diagnosis not present

## 2020-05-14 DIAGNOSIS — E440 Moderate protein-calorie malnutrition: Secondary | ICD-10-CM | POA: Diagnosis not present

## 2020-05-15 DIAGNOSIS — I361 Nonrheumatic tricuspid (valve) insufficiency: Secondary | ICD-10-CM

## 2020-05-18 DIAGNOSIS — I6389 Other cerebral infarction: Secondary | ICD-10-CM | POA: Diagnosis not present

## 2020-05-18 DIAGNOSIS — J90 Pleural effusion, not elsewhere classified: Secondary | ICD-10-CM | POA: Diagnosis not present

## 2020-05-18 DIAGNOSIS — G309 Alzheimer's disease, unspecified: Secondary | ICD-10-CM | POA: Diagnosis not present

## 2020-05-18 DIAGNOSIS — J449 Chronic obstructive pulmonary disease, unspecified: Secondary | ICD-10-CM

## 2020-05-19 DIAGNOSIS — G309 Alzheimer's disease, unspecified: Secondary | ICD-10-CM | POA: Diagnosis not present

## 2020-05-19 DIAGNOSIS — I6389 Other cerebral infarction: Secondary | ICD-10-CM | POA: Diagnosis not present

## 2020-05-19 DIAGNOSIS — J90 Pleural effusion, not elsewhere classified: Secondary | ICD-10-CM | POA: Diagnosis not present

## 2020-05-23 DIAGNOSIS — C342 Malignant neoplasm of middle lobe, bronchus or lung: Secondary | ICD-10-CM

## 2020-05-24 DIAGNOSIS — I6340 Cerebral infarction due to embolism of unspecified cerebral artery: Secondary | ICD-10-CM | POA: Diagnosis not present

## 2020-05-24 DIAGNOSIS — J189 Pneumonia, unspecified organism: Secondary | ICD-10-CM | POA: Diagnosis not present

## 2020-05-24 DIAGNOSIS — J1282 Pneumonia due to coronavirus disease 2019: Secondary | ICD-10-CM | POA: Diagnosis not present

## 2020-05-24 DIAGNOSIS — R911 Solitary pulmonary nodule: Secondary | ICD-10-CM | POA: Diagnosis not present

## 2020-05-24 DIAGNOSIS — C342 Malignant neoplasm of middle lobe, bronchus or lung: Secondary | ICD-10-CM | POA: Diagnosis not present

## 2020-05-24 DIAGNOSIS — Z9981 Dependence on supplemental oxygen: Secondary | ICD-10-CM | POA: Diagnosis not present

## 2020-05-24 DIAGNOSIS — I639 Cerebral infarction, unspecified: Secondary | ICD-10-CM | POA: Diagnosis not present

## 2020-05-24 DIAGNOSIS — J910 Malignant pleural effusion: Secondary | ICD-10-CM | POA: Diagnosis not present

## 2020-05-24 DIAGNOSIS — R918 Other nonspecific abnormal finding of lung field: Secondary | ICD-10-CM | POA: Diagnosis not present

## 2020-05-24 DIAGNOSIS — J449 Chronic obstructive pulmonary disease, unspecified: Secondary | ICD-10-CM | POA: Diagnosis not present

## 2020-05-24 DIAGNOSIS — J90 Pleural effusion, not elsewhere classified: Secondary | ICD-10-CM | POA: Diagnosis not present

## 2020-05-28 ENCOUNTER — Ambulatory Visit: Payer: Medicare HMO | Admitting: Adult Health

## 2020-06-29 DEATH — deceased

## 2020-07-11 ENCOUNTER — Telehealth: Payer: Self-pay | Admitting: Cardiology

## 2020-07-11 NOTE — Telephone Encounter (Signed)
    Pt's daughter Benjamine Mola calling, she said they received a bill from Dr. Bettina Gavia and he saw pt in [facility] it was charge on a incorrect insurance. They wanted to give correct insurance, she said she spoke with billing and [facility] and both of them said to call Dr. Bettina Gavia

## 2020-07-11 NOTE — Telephone Encounter (Signed)
Spoke with pt daughter. She stated there is a charge that was billed to the wrong insurance for her deceased mother. I gave her the phone number to [facility] billing department.
